# Patient Record
Sex: Female | Born: 1970 | Race: Black or African American | Hispanic: No | Smoking: Never smoker
Health system: Southern US, Community
[De-identification: ages and names within clinical notes are randomized; demographics above are authoritative.]

## PROBLEM LIST (undated history)

## (undated) DIAGNOSIS — Z973 Presence of spectacles and contact lenses: Secondary | ICD-10-CM

## (undated) DIAGNOSIS — E0500 Thyrotoxicosis with diffuse goiter without thyrotoxic crisis or storm: Secondary | ICD-10-CM

## (undated) DIAGNOSIS — G43909 Migraine, unspecified, not intractable, without status migrainosus: Secondary | ICD-10-CM

## (undated) DIAGNOSIS — Z9289 Personal history of other medical treatment: Secondary | ICD-10-CM

## (undated) DIAGNOSIS — R519 Headache, unspecified: Secondary | ICD-10-CM

## (undated) DIAGNOSIS — J302 Other seasonal allergic rhinitis: Secondary | ICD-10-CM

## (undated) DIAGNOSIS — I499 Cardiac arrhythmia, unspecified: Secondary | ICD-10-CM

## (undated) DIAGNOSIS — E039 Hypothyroidism, unspecified: Secondary | ICD-10-CM

## (undated) DIAGNOSIS — R51 Headache: Secondary | ICD-10-CM

## (undated) DIAGNOSIS — D219 Benign neoplasm of connective and other soft tissue, unspecified: Secondary | ICD-10-CM

## (undated) DIAGNOSIS — D649 Anemia, unspecified: Secondary | ICD-10-CM

## (undated) HISTORY — PX: KNEE ARTHROPLASTY: SHX992

## (undated) HISTORY — PX: UPPER GI ENDOSCOPY: SHX6162

## (undated) HISTORY — PX: CARPAL TUNNEL RELEASE: SHX101

## (undated) HISTORY — PX: FOOT SURGERY: SHX648

---

## 1999-04-25 ENCOUNTER — Other Ambulatory Visit: Admission: RE | Admit: 1999-04-25 | Discharge: 1999-04-25 | Payer: Self-pay | Admitting: *Deleted

## 2000-04-26 ENCOUNTER — Other Ambulatory Visit: Admission: RE | Admit: 2000-04-26 | Discharge: 2000-04-26 | Payer: Self-pay | Admitting: *Deleted

## 2000-05-07 ENCOUNTER — Emergency Department (HOSPITAL_COMMUNITY): Admission: EM | Admit: 2000-05-07 | Discharge: 2000-05-07 | Payer: Self-pay | Admitting: Emergency Medicine

## 2001-05-12 ENCOUNTER — Other Ambulatory Visit: Admission: RE | Admit: 2001-05-12 | Discharge: 2001-05-12 | Payer: Self-pay | Admitting: Obstetrics and Gynecology

## 2003-04-09 ENCOUNTER — Other Ambulatory Visit: Admission: RE | Admit: 2003-04-09 | Discharge: 2003-04-09 | Payer: Self-pay | Admitting: Obstetrics and Gynecology

## 2003-05-23 ENCOUNTER — Encounter: Admission: RE | Admit: 2003-05-23 | Discharge: 2003-05-23 | Payer: Self-pay | Admitting: Obstetrics and Gynecology

## 2004-02-01 ENCOUNTER — Encounter: Admission: RE | Admit: 2004-02-01 | Discharge: 2004-02-01 | Payer: Self-pay | Admitting: Obstetrics and Gynecology

## 2004-07-03 ENCOUNTER — Encounter: Admission: RE | Admit: 2004-07-03 | Discharge: 2004-07-03 | Payer: Self-pay | Admitting: Obstetrics and Gynecology

## 2005-07-16 ENCOUNTER — Emergency Department (HOSPITAL_COMMUNITY): Admission: EM | Admit: 2005-07-16 | Discharge: 2005-07-16 | Payer: Self-pay | Admitting: Emergency Medicine

## 2005-09-01 ENCOUNTER — Encounter: Admission: RE | Admit: 2005-09-01 | Discharge: 2005-09-01 | Payer: Self-pay | Admitting: Obstetrics and Gynecology

## 2006-11-02 ENCOUNTER — Encounter: Admission: RE | Admit: 2006-11-02 | Discharge: 2006-11-02 | Payer: Self-pay | Admitting: Obstetrics and Gynecology

## 2008-01-24 ENCOUNTER — Encounter: Admission: RE | Admit: 2008-01-24 | Discharge: 2008-01-24 | Payer: Self-pay | Admitting: Obstetrics and Gynecology

## 2009-07-08 ENCOUNTER — Encounter: Admission: RE | Admit: 2009-07-08 | Discharge: 2009-07-08 | Payer: Self-pay | Admitting: Obstetrics and Gynecology

## 2010-01-31 ENCOUNTER — Encounter: Admission: RE | Admit: 2010-01-31 | Discharge: 2010-01-31 | Payer: Self-pay | Admitting: Obstetrics and Gynecology

## 2010-04-13 ENCOUNTER — Encounter: Payer: Self-pay | Admitting: Family Medicine

## 2010-04-13 ENCOUNTER — Encounter: Payer: Self-pay | Admitting: Obstetrics and Gynecology

## 2010-04-14 ENCOUNTER — Encounter: Payer: Self-pay | Admitting: Obstetrics and Gynecology

## 2010-07-16 ENCOUNTER — Other Ambulatory Visit: Payer: Self-pay | Admitting: Obstetrics and Gynecology

## 2010-07-16 DIAGNOSIS — Z1231 Encounter for screening mammogram for malignant neoplasm of breast: Secondary | ICD-10-CM

## 2010-07-23 ENCOUNTER — Ambulatory Visit: Payer: Self-pay

## 2010-10-23 ENCOUNTER — Ambulatory Visit
Admission: RE | Admit: 2010-10-23 | Discharge: 2010-10-23 | Disposition: A | Discharge status: Home / Self Care | Payer: Private Health Insurance - Indemnity | Source: Ambulatory Visit | Attending: Obstetrics and Gynecology | Admitting: Obstetrics and Gynecology

## 2010-10-23 DIAGNOSIS — Z1231 Encounter for screening mammogram for malignant neoplasm of breast: Secondary | ICD-10-CM

## 2011-09-02 ENCOUNTER — Other Ambulatory Visit: Payer: Self-pay | Admitting: Family Medicine

## 2011-09-02 DIAGNOSIS — Z1231 Encounter for screening mammogram for malignant neoplasm of breast: Secondary | ICD-10-CM

## 2011-10-26 ENCOUNTER — Ambulatory Visit: Payer: Private Health Insurance - Indemnity

## 2011-11-24 ENCOUNTER — Other Ambulatory Visit: Payer: Self-pay | Admitting: Obstetrics and Gynecology

## 2011-11-24 ENCOUNTER — Ambulatory Visit
Admission: RE | Admit: 2011-11-24 | Discharge: 2011-11-24 | Disposition: A | Discharge status: Home / Self Care | Payer: BC Managed Care – PPO | Source: Ambulatory Visit | Attending: Family Medicine | Admitting: Family Medicine

## 2011-11-24 DIAGNOSIS — Z1231 Encounter for screening mammogram for malignant neoplasm of breast: Secondary | ICD-10-CM

## 2012-08-16 DIAGNOSIS — M25522 Pain in left elbow: Secondary | ICD-10-CM | POA: Insufficient documentation

## 2012-11-23 ENCOUNTER — Other Ambulatory Visit: Payer: Self-pay

## 2012-11-23 DIAGNOSIS — Z1231 Encounter for screening mammogram for malignant neoplasm of breast: Secondary | ICD-10-CM

## 2012-12-13 ENCOUNTER — Ambulatory Visit: Payer: BC Managed Care – PPO

## 2013-01-02 ENCOUNTER — Ambulatory Visit
Admission: RE | Admit: 2013-01-02 | Discharge: 2013-01-02 | Disposition: A | Discharge status: Home / Self Care | Payer: BC Managed Care – PPO | Source: Ambulatory Visit

## 2013-01-02 DIAGNOSIS — Z1231 Encounter for screening mammogram for malignant neoplasm of breast: Secondary | ICD-10-CM

## 2013-12-25 ENCOUNTER — Other Ambulatory Visit: Payer: Self-pay

## 2013-12-25 DIAGNOSIS — Z1239 Encounter for other screening for malignant neoplasm of breast: Secondary | ICD-10-CM

## 2014-01-08 ENCOUNTER — Ambulatory Visit: Payer: BC Managed Care – PPO

## 2014-01-25 ENCOUNTER — Inpatient Hospital Stay: Admission: RE | Admit: 2014-01-25 | Payer: BC Managed Care – PPO | Source: Ambulatory Visit

## 2014-02-20 ENCOUNTER — Ambulatory Visit
Admission: RE | Admit: 2014-02-20 | Discharge: 2014-02-20 | Disposition: A | Discharge status: Home / Self Care | Payer: BC Managed Care – PPO | Source: Ambulatory Visit

## 2014-02-20 ENCOUNTER — Other Ambulatory Visit: Payer: Self-pay

## 2014-02-20 DIAGNOSIS — Z1231 Encounter for screening mammogram for malignant neoplasm of breast: Secondary | ICD-10-CM

## 2014-05-15 ENCOUNTER — Other Ambulatory Visit: Payer: Self-pay | Admitting: Obstetrics and Gynecology

## 2014-05-15 DIAGNOSIS — D259 Leiomyoma of uterus, unspecified: Secondary | ICD-10-CM

## 2014-05-17 ENCOUNTER — Other Ambulatory Visit: Payer: Self-pay | Admitting: Obstetrics and Gynecology

## 2014-05-17 DIAGNOSIS — D259 Leiomyoma of uterus, unspecified: Secondary | ICD-10-CM

## 2014-05-29 ENCOUNTER — Ambulatory Visit
Admission: RE | Admit: 2014-05-29 | Discharge: 2014-05-29 | Disposition: A | Discharge status: Home / Self Care | Payer: BLUE CROSS/BLUE SHIELD | Source: Ambulatory Visit | Attending: Obstetrics and Gynecology | Admitting: Obstetrics and Gynecology

## 2014-05-29 DIAGNOSIS — D259 Leiomyoma of uterus, unspecified: Secondary | ICD-10-CM | POA: Insufficient documentation

## 2014-05-29 HISTORY — PX: IR GENERIC HISTORICAL: IMG1180011

## 2014-05-29 NOTE — Consult Note (Signed)
Chief Complaint: Chief Complaint  Patient presents with  . Fibroids    Consult for [place]     Referring Physician(s): Avon Gully  History of Present Illness: Carmen Henderson is a 44 y.o. (G2, P2) female with past medical history significant only for migraine headaches who presents to the interventional radiology clinic for evaluation of symptomatic uterine fibroids. The patient is unaccompanied and serves as her own historian.  Patient has history of menorrhagia dating back for several years though states this has significant late worsening during the past year. The patient states that her cycle remains regular occurring every 21 days and lasting for approximately 5 days, 2-3 days of which are associated with significant menorrhagia requiring the changing of pads every 1-2 hours. Patient denies intermittent bleeding or spotting. Patient previously has been anemic though is currently intermittently taking over the counter iron supplementations and was not noted to be anemic on most recently obtained CBC with hemoglobin of 12.3 (04/09/2014). The patient has never received a blood transfusion.  Patient also reports bulk symptoms including persistent mild abdominal and pelvic pain which significantly worsens at the time of her cycle. She also reports abdominal bloating. She denies constipation, diarrhea, bloody or melanotic stools. She admits to urinary frequency, urgency and nocturia, getting up to urinate approximately 2 times per evening. The patient does not report any perimenopausal symptoms.  The patient does not desire having any additional children and is not interested in undergoing a surgical procedure.  Past medical history: Migraine headaches  Past surgical history: #1-right-sided carpal tunnel release - 2015. #2-right-sided meniscus knee repair - 2009. #3-left sided plantar fasciitis repair - 2005.  Allergies: No known drug allergies. Patient denies latex or contrast  allergy.  Medications: Prior to Admission medications   Not on File    Family history: Noncontributory. Familial history of asthma, breast cancer, leukemia, diabetes, hypertension, stroke and thyroid dysfunction.  Social History: Patient is divorced. She is currently employed at [employer]. Patient has 2 children, a son 66 years old and a daughter who is 47 years old. Patient does not drink alcohol or smoke.  ECOG Status: 0 - Asymptomatic  Review of Systems: A 12 point ROS discussed and pertinent positives are indicated in the HPI above.  All other systems are negative.  Review of Systems  Constitutional: Negative.   Respiratory: Negative.   Gastrointestinal: Positive for abdominal pain. Negative for abdominal distention.  Genitourinary: Positive for frequency, menstrual problem and pelvic pain. Negative for dysuria, urgency, hematuria, flank pain, vaginal discharge and difficulty urinating.  Neurological: Positive for headaches.  Psychiatric/Behavioral: Negative.     Vital Signs: BP 136/81 mmHg  Pulse 90  Temp(Src) 98.5 F (36.9 C) (Oral)  Resp 15  Ht 4\' 11"  (1.499 m)  Wt 228 lb (103.42 kg)  BMI 46.03 kg/m2  SpO2 100%  LMP 05/22/2014 (Exact Date)  Physical Exam  Constitutional: She appears well-developed and well-nourished.  Cardiovascular: Normal rate, regular rhythm and intact distal pulses.   Pulmonary/Chest: Effort normal and breath sounds normal.  Abdominal: Soft. Bowel sounds are normal. She exhibits no distension and no mass. There is no tenderness.  I am unable to palpate the uterine fundus.  Skin: Skin is warm and dry.  Psychiatric: She has a normal mood and affect.   Imaging: Pelvic ultrasound performed 04/09/2014. Report only, no images.  The uterus is enlarged measuring 16.0 x 12.6 x 7.8 cm and is noted to contain several large fibroids, the  largest of which within the right uterine fundus measuring approximately 7.5 x 7.4 x 6.4 cm and  the largest of which within the left uterine fundus measuring 6.3 x 6.1 x 4.8 cm.  Labs:  Pap smear (04/09/2014) - negative for intraepithelial lesion or malignancy.  CBC: No results for input(s): WBC, HGB, HCT, PLT in the last 8760 hours.  COAGS: No results for input(s): INR, APTT in the last 8760 hours.  BMP: No results for input(s): NA, K, CL, CO2, GLUCOSE, BUN, CALCIUM, CREATININE, GFRNONAA, GFRAA in the last 8760 hours.  Invalid input(s): CMP  LIVER FUNCTION TESTS: No results for input(s): BILITOT, AST, ALT, ALKPHOS, PROT, ALBUMIN in the last 8760 hours.  Assessment and Plan:  This is a 44 y.o. (G2, P2) female with past medical history significant only for migraine headaches who presents to the interventional radiology clinic for evaluation of symptomatic uterine fibroids. Patient has history of menorrhagia dating back for several years though states this has significant late worsening during the past year. The patient states that her cycle remains regular occurring every 21 days and lasting for approximately 5 days, 2-3 days of which are associated with significant menorrhagia requiring the changing of pads every 1-2 hours. Patient denies intermittent bleeding or spotting.  The patient also reports bulk symptoms including persistent mild abdominal and pelvic pain which significantly worsens at the time of her cycle. She also reports abdominal bloating. She admits to urinary frequency, urgency and nocturia, getting up to urinate approximately 2 times per evening. The patient does not report any perimenopausal symptoms.  The patient had negative Pap smear performed 04/09/2014.   Prolonged discussions were held with the patient regarding alternative therapies including conservative management, hysterectomy and myomectomy. At the present time, the patient does not wish to undergo a surgical procedure.  As such, prolonged conversations were held with the patient regarding the risks  (including but not limited to bleeding, vessel injury, infection, nontarget embolization, contrast and radiation exposure) and benefits of uterine fibroid embolization.  Following this discussion, the patient wishes to proceed with the uterine fibroid embolization procedure.  We will arrange for the patient to have an endometrial biopsy as well as a pelvic MRI. Following the acquisition of these examinations, the uterine fibroid embolization will be scheduled at [facility]. The procedure will entail an overnight admission for observation and PCA usage.  Thank you for this interesting consult.  I greatly enjoyed meeting Carmen Henderson and look forward to participating in her care.  SignedSandi Mariscal 05/29/2014, 10:04 AM   I spent a total of 30 Minutes in face to face in clinical consultation, greater than 50% of which was counseling/coordinating care for symptomatic uterine fibroids.

## 2014-05-31 ENCOUNTER — Ambulatory Visit: Payer: BC Managed Care – PPO | Admitting: Family Medicine

## 2014-06-13 ENCOUNTER — Inpatient Hospital Stay: Admission: RE | Admit: 2014-06-13 | Payer: BLUE CROSS/BLUE SHIELD | Source: Ambulatory Visit

## 2014-07-01 ENCOUNTER — Inpatient Hospital Stay: Admission: RE | Admit: 2014-07-01 | Payer: BLUE CROSS/BLUE SHIELD | Source: Ambulatory Visit

## 2014-07-17 ENCOUNTER — Other Ambulatory Visit: Payer: Self-pay | Admitting: Obstetrics and Gynecology

## 2014-07-17 DIAGNOSIS — N644 Mastodynia: Secondary | ICD-10-CM

## 2014-07-23 ENCOUNTER — Ambulatory Visit
Admission: RE | Admit: 2014-07-23 | Discharge: 2014-07-23 | Disposition: A | Discharge status: Home / Self Care | Payer: BLUE CROSS/BLUE SHIELD | Source: Ambulatory Visit | Attending: Obstetrics and Gynecology | Admitting: Obstetrics and Gynecology

## 2014-07-23 DIAGNOSIS — N644 Mastodynia: Secondary | ICD-10-CM

## 2014-08-09 ENCOUNTER — Ambulatory Visit
Admission: RE | Admit: 2014-08-09 | Discharge: 2014-08-09 | Disposition: A | Discharge status: Home / Self Care | Payer: BLUE CROSS/BLUE SHIELD | Source: Ambulatory Visit | Attending: Obstetrics and Gynecology | Admitting: Obstetrics and Gynecology

## 2014-08-09 DIAGNOSIS — D259 Leiomyoma of uterus, unspecified: Secondary | ICD-10-CM

## 2014-08-09 MED ORDER — GADOBENATE DIMEGLUMINE 529 MG/ML IV SOLN
20.0000 mL | Freq: Once | INTRAVENOUS | Status: AC | PRN
  Administered 2014-08-09: 20 mL via INTRAVENOUS

## 2014-08-15 ENCOUNTER — Other Ambulatory Visit: Payer: Self-pay | Admitting: Interventional Radiology

## 2014-08-15 DIAGNOSIS — D259 Leiomyoma of uterus, unspecified: Secondary | ICD-10-CM

## 2014-08-27 ENCOUNTER — Ambulatory Visit (HOSPITAL_COMMUNITY): Payer: BLUE CROSS/BLUE SHIELD

## 2014-08-27 ENCOUNTER — Other Ambulatory Visit (HOSPITAL_COMMUNITY): Payer: BLUE CROSS/BLUE SHIELD

## 2014-09-07 ENCOUNTER — Other Ambulatory Visit: Payer: Self-pay | Admitting: Radiology

## 2014-09-10 ENCOUNTER — Encounter (HOSPITAL_COMMUNITY): Payer: Self-pay

## 2014-09-10 ENCOUNTER — Ambulatory Visit (HOSPITAL_COMMUNITY)
Admission: RE | Admit: 2014-09-10 | Discharge: 2014-09-10 | Disposition: A | Discharge status: Home / Self Care | Payer: BLUE CROSS/BLUE SHIELD | Source: Ambulatory Visit | Attending: Interventional Radiology | Admitting: Interventional Radiology

## 2014-09-10 ENCOUNTER — Observation Stay (HOSPITAL_COMMUNITY)
Admission: RE | Admit: 2014-09-10 | Discharge: 2014-09-11 | Disposition: A | Discharge status: Home / Self Care | Payer: BLUE CROSS/BLUE SHIELD | Source: Ambulatory Visit | Attending: Interventional Radiology | Admitting: Interventional Radiology

## 2014-09-10 DIAGNOSIS — D259 Leiomyoma of uterus, unspecified: Principal | ICD-10-CM | POA: Insufficient documentation

## 2014-09-10 DIAGNOSIS — G43909 Migraine, unspecified, not intractable, without status migrainosus: Secondary | ICD-10-CM | POA: Diagnosis not present

## 2014-09-10 HISTORY — DX: Benign neoplasm of connective and other soft tissue, unspecified: D21.9

## 2014-09-10 HISTORY — DX: Headache: R51

## 2014-09-10 HISTORY — DX: Migraine, unspecified, not intractable, without status migrainosus: G43.909

## 2014-09-10 HISTORY — DX: Headache, unspecified: R51.9

## 2014-09-10 LAB — BASIC METABOLIC PANEL
ANION GAP: 7 (ref 5–15)
BUN: 12 mg/dL (ref 6–20)
CALCIUM: 8.9 mg/dL (ref 8.9–10.3)
CO2: 24 mmol/L (ref 22–32)
CREATININE: 0.74 mg/dL (ref 0.44–1.00)
Chloride: 109 mmol/L (ref 101–111)
GFR calc Af Amer: >60 mL/min (ref >60)
GFR calc non Af Amer: >60 mL/min (ref >60)
GLUCOSE: 106 mg/dL — AB (ref 65–99)
Potassium: 4.1 mmol/L (ref 3.5–5.1)
Sodium: 140 mmol/L (ref 135–145)

## 2014-09-10 LAB — CBC WITH DIFFERENTIAL/PLATELET
BASOS ABS: 0 10*3/uL (ref 0.0–0.1)
Basophils Relative: 0 % (ref 0–1)
EOS ABS: 0 10*3/uL (ref 0.0–0.7)
EOS PCT: 0 % (ref 0–5)
HCT: 36.7 % (ref 36.0–46.0)
Hemoglobin: 11.6 g/dL — ABNORMAL LOW (ref 12.0–15.0)
LYMPHS PCT: 42 % (ref 12–46)
Lymphs Abs: 1.5 10*3/uL (ref 0.7–4.0)
MCH: 29.4 pg (ref 26.0–34.0)
MCHC: 31.6 g/dL (ref 30.0–36.0)
MCV: 93.1 fL (ref 78.0–100.0)
MONO ABS: 0.3 10*3/uL (ref 0.1–1.0)
Monocytes Relative: 7 % (ref 3–12)
Neutro Abs: 1.9 10*3/uL (ref 1.7–7.7)
Neutrophils Relative %: 51 % (ref 43–77)
Platelets: 203 10*3/uL (ref 150–400)
RBC: 3.94 MIL/uL (ref 3.87–5.11)
RDW: 14.5 % (ref 11.5–15.5)
WBC: 3.7 10*3/uL — ABNORMAL LOW (ref 4.0–10.5)

## 2014-09-10 LAB — PROTIME-INR
INR: 1.02 (ref 0.00–1.49)
Prothrombin Time: 13.6 seconds (ref 11.6–15.2)

## 2014-09-10 LAB — HCG, SERUM, QUALITATIVE: PREG SERUM: NEGATIVE

## 2014-09-10 MED ORDER — LIDOCAINE HCL 1 % IJ SOLN
INTRAMUSCULAR | Status: AC
  Filled 2014-09-10: qty 20

## 2014-09-10 MED ORDER — MIDAZOLAM HCL 2 MG/2ML IJ SOLN
INTRAMUSCULAR | Status: AC
  Filled 2014-09-10: qty 6

## 2014-09-10 MED ORDER — ONDANSETRON HCL 4 MG/2ML IJ SOLN
4.0000 mg | Freq: Four times a day (QID) | INTRAMUSCULAR | Status: DC | PRN
  Administered 2014-09-10: 4 mg via INTRAVENOUS

## 2014-09-10 MED ORDER — NALOXONE HCL 0.4 MG/ML IJ SOLN: 0.4000 mg | INTRAMUSCULAR | Status: DC | PRN

## 2014-09-10 MED ORDER — DIPHENHYDRAMINE HCL 12.5 MG/5ML PO ELIX: 12.5000 mg | ORAL_SOLUTION | Freq: Four times a day (QID) | ORAL | Status: DC | PRN

## 2014-09-10 MED ORDER — DIPHENHYDRAMINE HCL 12.5 MG/5ML PO ELIX
12.5000 mg | ORAL_SOLUTION | Freq: Four times a day (QID) | ORAL | Status: DC | PRN
  Filled 2014-09-10: qty 5

## 2014-09-10 MED ORDER — KETOROLAC TROMETHAMINE 30 MG/ML IJ SOLN
INTRAMUSCULAR | Status: AC
  Filled 2014-09-10: qty 1

## 2014-09-10 MED ORDER — FENTANYL CITRATE (PF) 100 MCG/2ML IJ SOLN
INTRAMUSCULAR | Status: AC | PRN
  Administered 2014-09-10: 25 ug via INTRAVENOUS
  Administered 2014-09-10: 50 ug via INTRAVENOUS
  Administered 2014-09-10: 25 ug via INTRAVENOUS

## 2014-09-10 MED ORDER — HYDROMORPHONE HCL 2 MG/ML IJ SOLN
INTRAMUSCULAR | Status: AC
  Filled 2014-09-10: qty 1

## 2014-09-10 MED ORDER — PROMETHAZINE HCL 25 MG RE SUPP: 25.0000 mg | Freq: Three times a day (TID) | RECTAL | Status: DC | PRN

## 2014-09-10 MED ORDER — TOPIRAMATE 25 MG PO TABS
25.0000 mg | ORAL_TABLET | Freq: Every evening | ORAL | Status: DC | PRN
  Administered 2014-09-10: 25 mg via ORAL
  Filled 2014-09-10 (×4): qty 1

## 2014-09-10 MED ORDER — SODIUM CHLORIDE 0.9 % IJ SOLN: 3.0000 mL | Freq: Two times a day (BID) | INTRAMUSCULAR | Status: DC

## 2014-09-10 MED ORDER — DIPHENHYDRAMINE HCL 50 MG/ML IJ SOLN: 12.5000 mg | Freq: Four times a day (QID) | INTRAMUSCULAR | Status: DC | PRN

## 2014-09-10 MED ORDER — KETOROLAC TROMETHAMINE 30 MG/ML IJ SOLN: 30.0000 mg | Freq: Once | INTRAMUSCULAR | Status: DC

## 2014-09-10 MED ORDER — SODIUM CHLORIDE 0.9 % IJ SOLN: 3.0000 mL | INTRAMUSCULAR | Status: DC | PRN

## 2014-09-10 MED ORDER — SODIUM CHLORIDE 0.9 % IV SOLN: 250.0000 mL | INTRAVENOUS | Status: DC | PRN

## 2014-09-10 MED ORDER — IOHEXOL 300 MG/ML  SOLN
25.0000 mL | Freq: Once | INTRAMUSCULAR | Status: AC | PRN
  Administered 2014-09-10: 85 mL via INTRA_ARTERIAL

## 2014-09-10 MED ORDER — DIPHENHYDRAMINE HCL 50 MG/ML IJ SOLN
12.5000 mg | Freq: Four times a day (QID) | INTRAMUSCULAR | Status: DC | PRN
  Administered 2014-09-10: 12.5 mg via INTRAVENOUS
  Filled 2014-09-10: qty 1

## 2014-09-10 MED ORDER — MIDAZOLAM HCL 2 MG/2ML IJ SOLN
INTRAMUSCULAR | Status: AC | PRN
  Administered 2014-09-10: 0.5 mg via INTRAVENOUS
  Administered 2014-09-10 (×2): 1 mg via INTRAVENOUS
  Administered 2014-09-10: 0.5 mg via INTRAVENOUS
  Administered 2014-09-10 (×3): 1 mg via INTRAVENOUS

## 2014-09-10 MED ORDER — IBUPROFEN 800 MG PO TABS
800.0000 mg | ORAL_TABLET | Freq: Four times a day (QID) | ORAL | Status: AC
  Administered 2014-09-10 (×2): 800 mg via ORAL
  Filled 2014-09-10 (×3): qty 1

## 2014-09-10 MED ORDER — SODIUM CHLORIDE 0.9 % IV SOLN
INTRAVENOUS | Status: DC
Start: 2014-09-10 — End: 2014-09-11
  Administered 2014-09-10: 09:00:00 via INTRAVENOUS

## 2014-09-10 MED ORDER — DOCUSATE SODIUM 100 MG PO CAPS
100.0000 mg | ORAL_CAPSULE | Freq: Two times a day (BID) | ORAL | Status: DC
  Administered 2014-09-10 – 2014-09-11 (×2): 100 mg via ORAL
  Filled 2014-09-10 (×4): qty 1

## 2014-09-10 MED ORDER — IOHEXOL 300 MG/ML  SOLN
80.0000 mL | Freq: Once | INTRAMUSCULAR | Status: AC | PRN
  Administered 2014-09-10: 25 mL via INTRA_ARTERIAL

## 2014-09-10 MED ORDER — PROMETHAZINE HCL 25 MG PO TABS
25.0000 mg | ORAL_TABLET | Freq: Three times a day (TID) | ORAL | Status: DC | PRN
  Administered 2014-09-10: 25 mg via ORAL
  Filled 2014-09-10: qty 1

## 2014-09-10 MED ORDER — KETOROLAC TROMETHAMINE 30 MG/ML IJ SOLN
30.0000 mg | Freq: Once | INTRAMUSCULAR | Status: AC
  Administered 2014-09-11: 30 mg via INTRAVENOUS
  Filled 2014-09-10: qty 1

## 2014-09-10 MED ORDER — HYDROMORPHONE 0.3 MG/ML IV SOLN
INTRAVENOUS | Status: DC
  Administered 2014-09-10: 2.9 mg via INTRAVENOUS
  Administered 2014-09-10: 18:00:00 via INTRAVENOUS
  Administered 2014-09-10: 6 mg via INTRAVENOUS
  Administered 2014-09-10: 13:00:00 via INTRAVENOUS
  Administered 2014-09-11: 1.8 mg via INTRAVENOUS
  Filled 2014-09-10: qty 25

## 2014-09-10 MED ORDER — CEFAZOLIN SODIUM-DEXTROSE 2-3 GM-% IV SOLR
INTRAVENOUS | Status: AC
  Filled 2014-09-10: qty 50

## 2014-09-10 MED ORDER — HYDROMORPHONE 0.3 MG/ML IV SOLN
INTRAVENOUS | Status: DC
  Administered 2014-09-10: 12:00:00 via INTRAVENOUS

## 2014-09-10 MED ORDER — KETOROLAC TROMETHAMINE 30 MG/ML IJ SOLN
30.0000 mg | Freq: Once | INTRAMUSCULAR | Status: AC
  Administered 2014-09-10: 30 mg via INTRAVENOUS
  Filled 2014-09-10: qty 1

## 2014-09-10 MED ORDER — SODIUM CHLORIDE 0.9 % IJ SOLN: 9.0000 mL | INTRAMUSCULAR | Status: DC | PRN

## 2014-09-10 MED ORDER — CEFAZOLIN SODIUM-DEXTROSE 2-3 GM-% IV SOLR
2.0000 g | Freq: Once | INTRAVENOUS | Status: AC
  Administered 2014-09-10: 2 g via INTRAVENOUS

## 2014-09-10 MED ORDER — HYDROMORPHONE 0.3 MG/ML IV SOLN
INTRAVENOUS | Status: AC
  Filled 2014-09-10: qty 25

## 2014-09-10 MED ORDER — ONDANSETRON HCL 4 MG/2ML IJ SOLN
4.0000 mg | Freq: Four times a day (QID) | INTRAMUSCULAR | Status: DC | PRN
  Filled 2014-09-10: qty 2

## 2014-09-10 MED ORDER — FENTANYL CITRATE (PF) 100 MCG/2ML IJ SOLN
INTRAMUSCULAR | Status: AC
Start: 2014-09-10 — End: 2014-09-10
  Filled 2014-09-10: qty 4

## 2014-09-10 MED ORDER — HYDROMORPHONE HCL 1 MG/ML IJ SOLN
INTRAMUSCULAR | Status: AC | PRN
  Administered 2014-09-10: 0.5 mg via INTRAVENOUS
  Administered 2014-09-10: 1 mg via INTRAVENOUS
  Administered 2014-09-10: 0.5 mg via INTRAVENOUS

## 2014-09-10 MED ORDER — SUMATRIPTAN SUCCINATE 50 MG PO TABS
50.0000 mg | ORAL_TABLET | ORAL | Status: DC | PRN
  Filled 2014-09-10: qty 1

## 2014-09-10 MED ORDER — ONDANSETRON HCL 4 MG/2ML IJ SOLN: 4.0000 mg | Freq: Four times a day (QID) | INTRAMUSCULAR | Status: DC | PRN

## 2014-09-10 NOTE — Progress Notes (Signed)
Referring Physician(s): Avon Gully Subjective: Patient admits to lower pelvic pain/cramping. She denies any right groin site pain. She denies any nausea with liquids.   Allergies: Review of patient's allergies indicates no known allergies.  Medications: Prior to Admission medications   Medication Sig Start Date End Date Taking? Authorizing Provider  aspirin-acetaminophen-caffeine (EXCEDRIN MIGRAINE) [phone] MG per tablet Take 2 tablets by mouth every 6 (six) hours as needed for headache.   Yes Historical Provider, MD  ferrous sulfate 325 (65 FE) MG EC tablet Take 325 mg by mouth daily.   Yes Historical Provider, MD  Multiple Vitamin (MULTIVITAMIN WITH MINERALS) TABS tablet Take 1 tablet by mouth daily.   Yes Historical Provider, MD  rizatriptan (MAXALT) 10 MG tablet Take 10 mg by mouth as needed for migraine. May repeat in 2 hours if needed   Yes Historical Provider, MD  topiramate (TOPAMAX) 25 MG tablet Take 25 mg by mouth at bedtime and may repeat dose one time if needed.   Yes Historical Provider, MD   Vital Signs: BP 125/70 mmHg  Pulse 81  Temp(Src) 97.6 F (36.4 C) (Oral)  Resp 13  SpO2 99%  Physical Exam General: NAD, in and out of sleep Abd: Soft, TTP lower pelvic region, ND Ext: RCFA access dressing C/D/I, soft, NT, no signs of bleeding/hematoma, DP 2+ B/L  Imaging: Ir Angiogram Pelvis Selective Or Supraselective  09/10/2014   INDICATION: History of symptomatic uterine fibroids. Patient initially seen in consultation on 05/29/2014 and presents today for fluoroscopic guided bilateral uterine artery embolization.  EXAM: 1. ULTRASOUND GUIDANCE FOR ARTERIAL ACCESS 2. FLUOROSCOPIC GUIDED BILATERAL UTERINE ARTERY EMBOLIZATION  MEDICATIONS: Ancef 2 gm IV; The antibiotic was administered within one hour of the procedure  ANESTHESIA/SEDATION: Fentanyl 100 mcg IV; Versed 6 mg IV; Dilaudid 2 mg IV; Toradol 30 mg IV  Sedation time  70 minutes  CONTRAST:  97mL OMNIPAQUE IOHEXOL  300 MG/ML SOLN, 88mL OMNIPAQUE IOHEXOL 300 MG/ML SOLN  FLUOROSCOPY TIME:  20 minutes 6 seconds (1,219 mGy)  COMPLICATIONS: None immediate  PROCEDURE: Informed consent was obtained from the patient following explanation of the procedure, risks, benefits and alternatives. The patient understands, agrees and consents for the procedure. All questions were addressed. A time out was performed prior to the initiation of the procedure. Maximal barrier sterile technique utilized including caps, mask, sterile gowns, sterile gloves, large sterile drape, hand hygiene, and Betadine prep.  The right femoral head was marked fluoroscopically. Under sterile conditions and local anesthesia, the right common femoral artery access was performed with a micropuncture needle. Under direct ultrasound guidance, the right common femoral was accessed with a micropuncture kit. An ultrasound image was saved for documentation purposes. This allowed for placement of a 5-French vascular sheath. A limited arteriogram was performed through the side arm of the sheath confirming appropriate access within the right common femoral artery.  The 5-French C2 catheter was utilized to select the contralateral left internal iliac artery. Selective left internal iliac angiogram was performed. The tortuous left uterine artery was identified. Selective catheterization was performed of the left uterine artery with a microcatheter and micro guide wire. A selective left uterine angiogram was performed. This demonstrated patency of the left uterine artery. Mild diffuse hypervascularity of the enlarged fibroid uterus. Access was adequate for embolization. For embolization, 3 vials of 500 - 700 micron Embospheres were injected into the left uterine artery. Post embolization angiogram confirms complete stasis of the left uterine vascular territory. Microcatheter was removed.  The C2 catheter was  retracted and utilized to select the right internal iliac artery.  Selective right internal iliac angiogram was performed. The patent right uterine artery was identified. For selective catheterization, the micro catheter and guidewire were utilized to select the right uterine artery. Selective right uterine angiogram was performed. This demonstrated patency of the right uterine artery. Catheter position was safe for embolization. Embolization was performed to complete stasis with injection of 2 vials of 500-700 micron Embospheres. Post embolization angiogram confirms complete stasis of the right uterine vascular territory.  At this point, all wires, catheters and sheaths were removed from the patient. Hemostasis was achieved at the right groin access site with  The patient tolerated the procedure well without immediate post procedural complication.  FINDINGS: Selective bilateral uterine artery arteriogram demonstrates diffuse hypervascularity of the enlarged fibroid uterus.  Completion arteriograms following bilateral uterine artery particle embolization demonstrates a technically excellent result with stasis of flow within the bilateral uterine vascular territories.  IMPRESSION: Successful bilateral uterine artery embolization (U F E).  PLAN: Patient will be admitted overnight for continued observation and PCA usage with hope for discharge in the a.m.  The patient will be seen in follow-up consultation in approximately 1 month. No imaging or laboratories will be necessary for this postprocedural evaluation.   Electronically Signed   By: Sandi Mariscal M.D.   On: 09/10/2014 15:07   Ir Angiogram Pelvis Selective Or Supraselective  09/10/2014   INDICATION: History of symptomatic uterine fibroids. Patient initially seen in consultation on 05/29/2014 and presents today for fluoroscopic guided bilateral uterine artery embolization.  EXAM: 1. ULTRASOUND GUIDANCE FOR ARTERIAL ACCESS 2. FLUOROSCOPIC GUIDED BILATERAL UTERINE ARTERY EMBOLIZATION  MEDICATIONS: Ancef 2 gm IV; The antibiotic  was administered within one hour of the procedure  ANESTHESIA/SEDATION: Fentanyl 100 mcg IV; Versed 6 mg IV; Dilaudid 2 mg IV; Toradol 30 mg IV  Sedation time  70 minutes  CONTRAST:  32mL OMNIPAQUE IOHEXOL 300 MG/ML SOLN, 36mL OMNIPAQUE IOHEXOL 300 MG/ML SOLN  FLUOROSCOPY TIME:  20 minutes 6 seconds (2,979 mGy)  COMPLICATIONS: None immediate  PROCEDURE: Informed consent was obtained from the patient following explanation of the procedure, risks, benefits and alternatives. The patient understands, agrees and consents for the procedure. All questions were addressed. A time out was performed prior to the initiation of the procedure. Maximal barrier sterile technique utilized including caps, mask, sterile gowns, sterile gloves, large sterile drape, hand hygiene, and Betadine prep.  The right femoral head was marked fluoroscopically. Under sterile conditions and local anesthesia, the right common femoral artery access was performed with a micropuncture needle. Under direct ultrasound guidance, the right common femoral was accessed with a micropuncture kit. An ultrasound image was saved for documentation purposes. This allowed for placement of a 5-French vascular sheath. A limited arteriogram was performed through the side arm of the sheath confirming appropriate access within the right common femoral artery.  The 5-French C2 catheter was utilized to select the contralateral left internal iliac artery. Selective left internal iliac angiogram was performed. The tortuous left uterine artery was identified. Selective catheterization was performed of the left uterine artery with a microcatheter and micro guide wire. A selective left uterine angiogram was performed. This demonstrated patency of the left uterine artery. Mild diffuse hypervascularity of the enlarged fibroid uterus. Access was adequate for embolization. For embolization, 3 vials of 500 - 700 micron Embospheres were injected into the left uterine artery. Post  embolization angiogram confirms complete stasis of the left uterine vascular territory. Microcatheter was removed.  The C2 catheter was retracted and utilized to select the right internal iliac artery. Selective right internal iliac angiogram was performed. The patent right uterine artery was identified. For selective catheterization, the micro catheter and guidewire were utilized to select the right uterine artery. Selective right uterine angiogram was performed. This demonstrated patency of the right uterine artery. Catheter position was safe for embolization. Embolization was performed to complete stasis with injection of 2 vials of 500-700 micron Embospheres. Post embolization angiogram confirms complete stasis of the right uterine vascular territory.  At this point, all wires, catheters and sheaths were removed from the patient. Hemostasis was achieved at the right groin access site with  The patient tolerated the procedure well without immediate post procedural complication.  FINDINGS: Selective bilateral uterine artery arteriogram demonstrates diffuse hypervascularity of the enlarged fibroid uterus.  Completion arteriograms following bilateral uterine artery particle embolization demonstrates a technically excellent result with stasis of flow within the bilateral uterine vascular territories.  IMPRESSION: Successful bilateral uterine artery embolization (U F E).  PLAN: Patient will be admitted overnight for continued observation and PCA usage with hope for discharge in the a.m.  The patient will be seen in follow-up consultation in approximately 1 month. No imaging or laboratories will be necessary for this postprocedural evaluation.   Electronically Signed   By: Sandi Mariscal M.D.   On: 09/10/2014 15:07   Ir Angiogram Selective Each Additional Vessel  09/10/2014   INDICATION: History of symptomatic uterine fibroids. Patient initially seen in consultation on 05/29/2014 and presents today for fluoroscopic  guided bilateral uterine artery embolization.  EXAM: 1. ULTRASOUND GUIDANCE FOR ARTERIAL ACCESS 2. FLUOROSCOPIC GUIDED BILATERAL UTERINE ARTERY EMBOLIZATION  MEDICATIONS: Ancef 2 gm IV; The antibiotic was administered within one hour of the procedure  ANESTHESIA/SEDATION: Fentanyl 100 mcg IV; Versed 6 mg IV; Dilaudid 2 mg IV; Toradol 30 mg IV  Sedation time  70 minutes  CONTRAST:  53mL OMNIPAQUE IOHEXOL 300 MG/ML SOLN, 52mL OMNIPAQUE IOHEXOL 300 MG/ML SOLN  FLUOROSCOPY TIME:  20 minutes 6 seconds (2,778 mGy)  COMPLICATIONS: None immediate  PROCEDURE: Informed consent was obtained from the patient following explanation of the procedure, risks, benefits and alternatives. The patient understands, agrees and consents for the procedure. All questions were addressed. A time out was performed prior to the initiation of the procedure. Maximal barrier sterile technique utilized including caps, mask, sterile gowns, sterile gloves, large sterile drape, hand hygiene, and Betadine prep.  The right femoral head was marked fluoroscopically. Under sterile conditions and local anesthesia, the right common femoral artery access was performed with a micropuncture needle. Under direct ultrasound guidance, the right common femoral was accessed with a micropuncture kit. An ultrasound image was saved for documentation purposes. This allowed for placement of a 5-French vascular sheath. A limited arteriogram was performed through the side arm of the sheath confirming appropriate access within the right common femoral artery.  The 5-French C2 catheter was utilized to select the contralateral left internal iliac artery. Selective left internal iliac angiogram was performed. The tortuous left uterine artery was identified. Selective catheterization was performed of the left uterine artery with a microcatheter and micro guide wire. A selective left uterine angiogram was performed. This demonstrated patency of the left uterine artery. Mild  diffuse hypervascularity of the enlarged fibroid uterus. Access was adequate for embolization. For embolization, 3 vials of 500 - 700 micron Embospheres were injected into the left uterine artery. Post embolization angiogram confirms complete stasis of the left uterine vascular territory.  Microcatheter was removed.  The C2 catheter was retracted and utilized to select the right internal iliac artery. Selective right internal iliac angiogram was performed. The patent right uterine artery was identified. For selective catheterization, the micro catheter and guidewire were utilized to select the right uterine artery. Selective right uterine angiogram was performed. This demonstrated patency of the right uterine artery. Catheter position was safe for embolization. Embolization was performed to complete stasis with injection of 2 vials of 500-700 micron Embospheres. Post embolization angiogram confirms complete stasis of the right uterine vascular territory.  At this point, all wires, catheters and sheaths were removed from the patient. Hemostasis was achieved at the right groin access site with  The patient tolerated the procedure well without immediate post procedural complication.  FINDINGS: Selective bilateral uterine artery arteriogram demonstrates diffuse hypervascularity of the enlarged fibroid uterus.  Completion arteriograms following bilateral uterine artery particle embolization demonstrates a technically excellent result with stasis of flow within the bilateral uterine vascular territories.  IMPRESSION: Successful bilateral uterine artery embolization (U F E).  PLAN: Patient will be admitted overnight for continued observation and PCA usage with hope for discharge in the a.m.  The patient will be seen in follow-up consultation in approximately 1 month. No imaging or laboratories will be necessary for this postprocedural evaluation.   Electronically Signed   By: Sandi Mariscal M.D.   On: 09/10/2014 15:07   Ir  Angiogram Selective Each Additional Vessel  09/10/2014   INDICATION: History of symptomatic uterine fibroids. Patient initially seen in consultation on 05/29/2014 and presents today for fluoroscopic guided bilateral uterine artery embolization.  EXAM: 1. ULTRASOUND GUIDANCE FOR ARTERIAL ACCESS 2. FLUOROSCOPIC GUIDED BILATERAL UTERINE ARTERY EMBOLIZATION  MEDICATIONS: Ancef 2 gm IV; The antibiotic was administered within one hour of the procedure  ANESTHESIA/SEDATION: Fentanyl 100 mcg IV; Versed 6 mg IV; Dilaudid 2 mg IV; Toradol 30 mg IV  Sedation time  70 minutes  CONTRAST:  95mL OMNIPAQUE IOHEXOL 300 MG/ML SOLN, 58mL OMNIPAQUE IOHEXOL 300 MG/ML SOLN  FLUOROSCOPY TIME:  20 minutes 6 seconds (8,638 mGy)  COMPLICATIONS: None immediate  PROCEDURE: Informed consent was obtained from the patient following explanation of the procedure, risks, benefits and alternatives. The patient understands, agrees and consents for the procedure. All questions were addressed. A time out was performed prior to the initiation of the procedure. Maximal barrier sterile technique utilized including caps, mask, sterile gowns, sterile gloves, large sterile drape, hand hygiene, and Betadine prep.  The right femoral head was marked fluoroscopically. Under sterile conditions and local anesthesia, the right common femoral artery access was performed with a micropuncture needle. Under direct ultrasound guidance, the right common femoral was accessed with a micropuncture kit. An ultrasound image was saved for documentation purposes. This allowed for placement of a 5-French vascular sheath. A limited arteriogram was performed through the side arm of the sheath confirming appropriate access within the right common femoral artery.  The 5-French C2 catheter was utilized to select the contralateral left internal iliac artery. Selective left internal iliac angiogram was performed. The tortuous left uterine artery was identified. Selective  catheterization was performed of the left uterine artery with a microcatheter and micro guide wire. A selective left uterine angiogram was performed. This demonstrated patency of the left uterine artery. Mild diffuse hypervascularity of the enlarged fibroid uterus. Access was adequate for embolization. For embolization, 3 vials of 500 - 700 micron Embospheres were injected into the left uterine artery. Post embolization angiogram confirms complete stasis of the  left uterine vascular territory. Microcatheter was removed.  The C2 catheter was retracted and utilized to select the right internal iliac artery. Selective right internal iliac angiogram was performed. The patent right uterine artery was identified. For selective catheterization, the micro catheter and guidewire were utilized to select the right uterine artery. Selective right uterine angiogram was performed. This demonstrated patency of the right uterine artery. Catheter position was safe for embolization. Embolization was performed to complete stasis with injection of 2 vials of 500-700 micron Embospheres. Post embolization angiogram confirms complete stasis of the right uterine vascular territory.  At this point, all wires, catheters and sheaths were removed from the patient. Hemostasis was achieved at the right groin access site with  The patient tolerated the procedure well without immediate post procedural complication.  FINDINGS: Selective bilateral uterine artery arteriogram demonstrates diffuse hypervascularity of the enlarged fibroid uterus.  Completion arteriograms following bilateral uterine artery particle embolization demonstrates a technically excellent result with stasis of flow within the bilateral uterine vascular territories.  IMPRESSION: Successful bilateral uterine artery embolization (U F E).  PLAN: Patient will be admitted overnight for continued observation and PCA usage with hope for discharge in the a.m.  The patient will be seen in  follow-up consultation in approximately 1 month. No imaging or laboratories will be necessary for this postprocedural evaluation.   Electronically Signed   By: Sandi Mariscal M.D.   On: 09/10/2014 15:07   Ir US Guide Vasc Access Right  09/10/2014   INDICATION: History of symptomatic uterine fibroids. Patient initially seen in consultation on 05/29/2014 and presents today for fluoroscopic guided bilateral uterine artery embolization.  EXAM: 1. ULTRASOUND GUIDANCE FOR ARTERIAL ACCESS 2. FLUOROSCOPIC GUIDED BILATERAL UTERINE ARTERY EMBOLIZATION  MEDICATIONS: Ancef 2 gm IV; The antibiotic was administered within one hour of the procedure  ANESTHESIA/SEDATION: Fentanyl 100 mcg IV; Versed 6 mg IV; Dilaudid 2 mg IV; Toradol 30 mg IV  Sedation time  70 minutes  CONTRAST:  49mL OMNIPAQUE IOHEXOL 300 MG/ML SOLN, 13mL OMNIPAQUE IOHEXOL 300 MG/ML SOLN  FLUOROSCOPY TIME:  20 minutes 6 seconds (9,480 mGy)  COMPLICATIONS: None immediate  PROCEDURE: Informed consent was obtained from the patient following explanation of the procedure, risks, benefits and alternatives. The patient understands, agrees and consents for the procedure. All questions were addressed. A time out was performed prior to the initiation of the procedure. Maximal barrier sterile technique utilized including caps, mask, sterile gowns, sterile gloves, large sterile drape, hand hygiene, and Betadine prep.  The right femoral head was marked fluoroscopically. Under sterile conditions and local anesthesia, the right common femoral artery access was performed with a micropuncture needle. Under direct ultrasound guidance, the right common femoral was accessed with a micropuncture kit. An ultrasound image was saved for documentation purposes. This allowed for placement of a 5-French vascular sheath. A limited arteriogram was performed through the side arm of the sheath confirming appropriate access within the right common femoral artery.  The 5-French C2 catheter was  utilized to select the contralateral left internal iliac artery. Selective left internal iliac angiogram was performed. The tortuous left uterine artery was identified. Selective catheterization was performed of the left uterine artery with a microcatheter and micro guide wire. A selective left uterine angiogram was performed. This demonstrated patency of the left uterine artery. Mild diffuse hypervascularity of the enlarged fibroid uterus. Access was adequate for embolization. For embolization, 3 vials of 500 - 700 micron Embospheres were injected into the left uterine artery. Post embolization angiogram confirms  complete stasis of the left uterine vascular territory. Microcatheter was removed.  The C2 catheter was retracted and utilized to select the right internal iliac artery. Selective right internal iliac angiogram was performed. The patent right uterine artery was identified. For selective catheterization, the micro catheter and guidewire were utilized to select the right uterine artery. Selective right uterine angiogram was performed. This demonstrated patency of the right uterine artery. Catheter position was safe for embolization. Embolization was performed to complete stasis with injection of 2 vials of 500-700 micron Embospheres. Post embolization angiogram confirms complete stasis of the right uterine vascular territory.  At this point, all wires, catheters and sheaths were removed from the patient. Hemostasis was achieved at the right groin access site with  The patient tolerated the procedure well without immediate post procedural complication.  FINDINGS: Selective bilateral uterine artery arteriogram demonstrates diffuse hypervascularity of the enlarged fibroid uterus.  Completion arteriograms following bilateral uterine artery particle embolization demonstrates a technically excellent result with stasis of flow within the bilateral uterine vascular territories.  IMPRESSION: Successful bilateral  uterine artery embolization (U F E).  PLAN: Patient will be admitted overnight for continued observation and PCA usage with hope for discharge in the a.m.  The patient will be seen in follow-up consultation in approximately 1 month. No imaging or laboratories will be necessary for this postprocedural evaluation.   Electronically Signed   By: Sandi Mariscal M.D.   On: 09/10/2014 15:07   Ir Embo Tumor Organ Ischemia Infarct Inc Guide Roadmapping  09/10/2014   INDICATION: History of symptomatic uterine fibroids. Patient initially seen in consultation on 05/29/2014 and presents today for fluoroscopic guided bilateral uterine artery embolization.  EXAM: 1. ULTRASOUND GUIDANCE FOR ARTERIAL ACCESS 2. FLUOROSCOPIC GUIDED BILATERAL UTERINE ARTERY EMBOLIZATION  MEDICATIONS: Ancef 2 gm IV; The antibiotic was administered within one hour of the procedure  ANESTHESIA/SEDATION: Fentanyl 100 mcg IV; Versed 6 mg IV; Dilaudid 2 mg IV; Toradol 30 mg IV  Sedation time  70 minutes  CONTRAST:  27mL OMNIPAQUE IOHEXOL 300 MG/ML SOLN, 70mL OMNIPAQUE IOHEXOL 300 MG/ML SOLN  FLUOROSCOPY TIME:  20 minutes 6 seconds (4,481 mGy)  COMPLICATIONS: None immediate  PROCEDURE: Informed consent was obtained from the patient following explanation of the procedure, risks, benefits and alternatives. The patient understands, agrees and consents for the procedure. All questions were addressed. A time out was performed prior to the initiation of the procedure. Maximal barrier sterile technique utilized including caps, mask, sterile gowns, sterile gloves, large sterile drape, hand hygiene, and Betadine prep.  The right femoral head was marked fluoroscopically. Under sterile conditions and local anesthesia, the right common femoral artery access was performed with a micropuncture needle. Under direct ultrasound guidance, the right common femoral was accessed with a micropuncture kit. An ultrasound image was saved for documentation purposes. This allowed for  placement of a 5-French vascular sheath. A limited arteriogram was performed through the side arm of the sheath confirming appropriate access within the right common femoral artery.  The 5-French C2 catheter was utilized to select the contralateral left internal iliac artery. Selective left internal iliac angiogram was performed. The tortuous left uterine artery was identified. Selective catheterization was performed of the left uterine artery with a microcatheter and micro guide wire. A selective left uterine angiogram was performed. This demonstrated patency of the left uterine artery. Mild diffuse hypervascularity of the enlarged fibroid uterus. Access was adequate for embolization. For embolization, 3 vials of 500 - 700 micron Embospheres were injected into the  left uterine artery. Post embolization angiogram confirms complete stasis of the left uterine vascular territory. Microcatheter was removed.  The C2 catheter was retracted and utilized to select the right internal iliac artery. Selective right internal iliac angiogram was performed. The patent right uterine artery was identified. For selective catheterization, the micro catheter and guidewire were utilized to select the right uterine artery. Selective right uterine angiogram was performed. This demonstrated patency of the right uterine artery. Catheter position was safe for embolization. Embolization was performed to complete stasis with injection of 2 vials of 500-700 micron Embospheres. Post embolization angiogram confirms complete stasis of the right uterine vascular territory.  At this point, all wires, catheters and sheaths were removed from the patient. Hemostasis was achieved at the right groin access site with  The patient tolerated the procedure well without immediate post procedural complication.  FINDINGS: Selective bilateral uterine artery arteriogram demonstrates diffuse hypervascularity of the enlarged fibroid uterus.  Completion  arteriograms following bilateral uterine artery particle embolization demonstrates a technically excellent result with stasis of flow within the bilateral uterine vascular territories.  IMPRESSION: Successful bilateral uterine artery embolization (U F E).  PLAN: Patient will be admitted overnight for continued observation and PCA usage with hope for discharge in the a.m.  The patient will be seen in follow-up consultation in approximately 1 month. No imaging or laboratories will be necessary for this postprocedural evaluation.   Electronically Signed   By: Sandi Mariscal M.D.   On: 09/10/2014 15:07   Labs:  CBC:  Recent Labs  09/10/14 0859  WBC 3.7*  HGB 11.6*  HCT 36.7  PLT 203    COAGS:  Recent Labs  09/10/14 0859  INR 1.02    BMP:  Recent Labs  09/10/14 0859  NA 140  K 4.1  CL 109  CO2 24  GLUCOSE 106*  BUN 12  CALCIUM 8.9  CREATININE 0.74  GFRNONAA >60  GFRAA >60   Assessment and Plan: Symptomatic uterine fibroids S/p successful UFE  Admit overnight for pain control Discharge in am if stable with 1 month follow-up   Signed: Hedy Jacob 09/10/2014, 3:52 PM

## 2014-09-10 NOTE — Procedures (Signed)
Post UFE.  No immediate complications.  Keep right leg straight for 4 hrs.

## 2014-09-10 NOTE — Discharge Instructions (Signed)
Uterine Artery Embolization for Fibroids, Care After Refer to this sheet in the next few weeks. These instructions provide you with information on caring for yourself after your procedure. Your health care provider may also give you more specific instructions. Your treatment has been planned according to current medical practices, but problems sometimes occur. Call your health care provider if you have any problems or questions after your procedure. WHAT TO EXPECT AFTER THE PROCEDURE After your procedure, it is typical to have cramping in the pelvis. You will be given pain medicine to control it. HOME CARE INSTRUCTIONS  Only take over-the-counter or prescription medicines for pain, discomfort, or fever as directed by your health care provider.  Do not take aspirin. It can cause bleeding.  Follow your health care provider's advice regarding medicines given to you, diet, activity, and when to begin sexual activity.  See your health care provider for follow-up care as directed. SEEK MEDICAL CARE IF:  You have a fever.  You have redness, swelling, and pain around your incision site.  You have pus draining from your incision.  You have a rash. SEEK IMMEDIATE MEDICAL CARE IF:  You have bleeding from your incision site.  You have difficulty breathing.  You have chest pain.  You have severe abdominal pain.  You have leg pain.  You become dizzy and faint. Document Released: 12/28/2012 Document Reviewed: 12/28/2012 [facility] Patient Information 2015 [place], [place]. This information is not intended to replace advice given to you by your health care provider. Make sure you discuss any questions you have with your health care provider. Uterine Artery Embolization for Fibroids Uterine artery embolization is a nonsurgical treatment to shrink fibroids. A thin plastic tube (catheter) is used to inject material that blocks off the blood supply to the fibroid, which causes the fibroid to  shrink. LET [facility] CARE PROVIDER KNOW ABOUT:  Any allergies you have.  All medicines you are taking, including vitamins, herbs, eye drops, creams, and over-the-counter medicines.  Previous problems you or members of your family have had with the use of anesthetics.  Any blood disorders you have.  Previous surgeries you have had.  Medical conditions you have. RISKS AND COMPLICATIONS  Injury to the uterus from decreased blood supply  Infection.  Blood infection (septicemia).  Lack of menstrual periods (amenorrhea).  Death of tissue cells (necrosis) around your bladder or vulva.  Development of a hole between organs or from an organ to the surface of your skin (fistula).  Blood clot in the legs (deep vein thrombosis) or lung (pulmonary embolus). BEFORE THE PROCEDURE  Ask your health care provider about changing or stopping your regular medicines.   Do not take aspirin or blood thinners (anticoagulants) for 1 week before the surgery or as directed by your health care provider.  Do not eat or drink anything for 8 hours before the surgery or as directed by your health care provider.   Empty your bladder before the procedure begins. PROCEDURE   An IV tube will be placed into one of your veins. This will be used to give you a sedative and pain medication (conscious sedation).  You will be given a medicine that numbs the area (local anesthetic).  A small cut will be made in your groin. A catheter is then inserted into the main artery of your leg.  The catheter will be guided through the artery to your uterus. A series of images will be taken while dye is injected through the catheter in your  groin. X-rays are taken at the same time. This is done to provide a road map of the blood supply to your uterus and fibroids.  Tiny plastic spheres, about the size of sand grains, will be injected through the catheter. Metal coils may be used to help block the artery. The particles  will lodge in tiny branches of the uterine artery that supplies blood to the fibroids.  The procedure is repeated on the artery that supplies the other side of the uterus.  The catheter is then removed and pressure is held to stop any bleeding. No stitches are needed.  A dressing is then placed over the cut (incision). AFTER THE PROCEDURE  You will be taken to a recovery area where your progress will be monitored until you are awake, stable, and taking fluids well. If there are no other problems, you will then be moved to a regular hospital room.  You will be observed overnight in the hospital.  You will have cramping that should be controlled with pain medication. Document Released: 05/25/2005 Document Revised: 12/28/2012 Document Reviewed: 09/22/2012 [facility] Patient Information 2015 [place], [place]. This information is not intended to replace advice given to you by your health care provider. Make sure you discuss any questions you have with your health care provider. Conscious Sedation Sedation is the use of medicines to promote relaxation and relieve discomfort and anxiety. Conscious sedation is a type of sedation. Under conscious sedation you are less alert than normal but are still able to respond to instructions or stimulation. Conscious sedation is used during short medical and dental procedures. It is milder than deep sedation or general anesthesia and allows you to return to your regular activities sooner.  LET [facility] CARE PROVIDER KNOW ABOUT:   Any allergies you have.  All medicines you are taking, including vitamins, herbs, eye drops, creams, and over-the-counter medicines.  Use of steroids (by mouth or creams).  Previous problems you or members of your family have had with the use of anesthetics.  Any blood disorders you have.  Previous surgeries you have had.  Medical conditions you have.  Possibility of pregnancy, if this applies.  Use of cigarettes, alcohol,  or illegal drugs. RISKS AND COMPLICATIONS Generally, this is a safe procedure. However, as with any procedure, problems can occur. Possible problems include:  Oversedation.  Trouble breathing on your own. You may need to have a breathing tube until you are awake and breathing on your own.  Allergic reaction to any of the medicines used for the procedure. BEFORE THE PROCEDURE  You may have blood tests done. These tests can help show how well your kidneys and liver are working. They can also show how well your blood clots.  A physical exam will be done.  Only take medicines as directed by your health care provider. You may need to stop taking medicines (such as blood thinners, aspirin, or nonsteroidal anti-inflammatory drugs) before the procedure.   Do not eat or drink at least 6 hours before the procedure or as directed by your health care provider.  Arrange for a responsible adult, family member, or friend to take you home after the procedure. He or she should stay with you for at least 24 hours after the procedure, until the medicine has worn off. PROCEDURE   An intravenous (IV) catheter will be inserted into one of your veins. Medicine will be able to flow directly into your body through this catheter. You may be given medicine through this tube  to help prevent pain and help you relax.  The medical or dental procedure will be done. AFTER THE PROCEDURE  You will stay in a recovery area until the medicine has worn off. Your blood pressure and pulse will be checked.   Depending on the procedure you had, you may be allowed to go home when you can tolerate liquids and your pain is under control. Document Released: 12/02/2000 Document Revised: 03/14/2013 Document Reviewed: 11/14/2012 [facility] Patient Information 2015 [place], [place]. This information is not intended to replace advice given to you by your health care provider. Make sure you discuss any questions you have with your  health care provider.

## 2014-09-10 NOTE — Sedation Documentation (Signed)
Left side Rx begun.

## 2014-09-10 NOTE — H&P (Signed)
Referring Physician(s): Avon Gully  History of Present Illness: Carmen Henderson is a 44 y.o. female who has been seen in consult with IR on 05/29/14 with c/o menorrhagia, dysmenorrhea and migraines since approximately October. She has had a MRI and endometrial biopsy and has been deemed a candidate for uterine fibroid embolization. She rates her current lower pelvic pain 4/10 today. She denies any active spotting, denies any nausea or vomiting. She denies any chest pain, shortness of breath or palpitations. She denies any active signs of bleeding or excessive bruising. She denies any recent fever, chills or urinary symptoms. The patient denies any history of sleep apnea or chronic oxygen use. She denies any known complications to sedation, but has not had it before.    Past Medical History  Diagnosis Date  . Headache   . Migraine   . Fibroids     Past Surgical History  Procedure Laterality Date  . Carpal tunnel release Right   . Knee arthroplasty Right   . Foot surgery Left   . Cesarean section      x2    Allergies: Review of patient's allergies indicates no known allergies.  Medications: Prior to Admission medications   Medication Sig Start Date End Date Taking? Authorizing Provider  aspirin-acetaminophen-caffeine (EXCEDRIN MIGRAINE) [id number] MG per tablet Take 2 tablets by mouth every 6 (six) hours as needed for headache.   Yes Historical Provider, MD  ferrous sulfate 325 (65 FE) MG EC tablet Take 325 mg by mouth daily.   Yes Historical Provider, MD  Multiple Vitamin (MULTIVITAMIN WITH MINERALS) TABS tablet Take 1 tablet by mouth daily.   Yes Historical Provider, MD  rizatriptan (MAXALT) 10 MG tablet Take 10 mg by mouth as needed for migraine. May repeat in 2 hours if needed   Yes Historical Provider, MD  topiramate (TOPAMAX) 25 MG tablet Take 25 mg by mouth at bedtime and may repeat dose one time if needed.   Yes Historical Provider, MD     History reviewed. No  pertinent family history.  History   Social History  . Marital Status: Single    Spouse Name: N/A  . Number of Children: N/A  . Years of Education: N/A   Social History Main Topics  . Smoking status: Never Smoker   . Smokeless tobacco: Never Used  . Alcohol Use: No  . Drug Use: No  . Sexual Activity: Not on file   Other Topics Concern  . None   Social History Narrative   Review of Systems: A 12 point ROS discussed and pertinent positives are indicated in the HPI above.  All other systems are negative.  Review of Systems  Vital Signs: BP 132/95 mmHg  Pulse 90  Temp(Src) 97.7 F (36.5 C) (Oral)  Resp 18  SpO2 98%  Physical Exam  Constitutional: She is oriented to person, place, and time. No distress.  HENT:  Head: Normocephalic and atraumatic.  Neck: No tracheal deviation present.  Cardiovascular: Normal rate, regular rhythm and intact distal pulses.  Exam reveals no gallop and no friction rub.   No murmur heard. Pulmonary/Chest: Effort normal and breath sounds normal. No respiratory distress. She has no wheezes. She has no rales.  Abdominal: Soft. Bowel sounds are normal. She exhibits distension. There is tenderness.  Neurological: She is alert and oriented to person, place, and time.  Skin: Skin is warm and dry. She is not diaphoretic.  Psychiatric: She has a normal mood and affect. Her behavior is normal.  Thought content normal.    Mallampati Score:  MD Evaluation Airway: WNL Heart: WNL Abdomen: WNL Chest/ Lungs: WNL ASA  Classification: 2 Mallampati/Airway Score: Two  Imaging: No results found.  Labs:  CBC:  Recent Labs  09/10/14 0859  WBC 3.7*  HGB 11.6*  HCT 36.7  PLT 203    COAGS:  Recent Labs  09/10/14 0859  INR 1.02    BMP:  Recent Labs  09/10/14 0859  NA 140  K 4.1  CL 109  CO2 24  GLUCOSE 106*  BUN 12  CALCIUM 8.9  CREATININE 0.74  GFRNONAA >60  GFRAA >60    Assessment and Plan: Symptomatic uterine  fibroids Seen in full consult 05/29/14 by IR MRI done, endometrial biopsy without evidence of malignancy Scheduled today for image guided uterine fibroid embolization with sedation and overnight admission for observation and pain control The patient has been NPO, no blood thinners taken, labs and vitals have been reviewed. Risks and Benefits discussed with the patient including, but not limited to bleeding, infection, vascular injury, post procedural pain, nausea, vomiting, fatigue and contrast induced renal failure. All of the patient's questions were answered, patient is agreeable to proceed. Consent signed and in chart.    SignedHedy Jacob 09/10/2014, 10:04 AM

## 2014-09-10 NOTE — Sedation Documentation (Signed)
Right side Rx begun.

## 2014-09-10 NOTE — Sedation Documentation (Addendum)
5Fr sheath removed from R femoral artery by Dr. Watts.  Hemostasis achieved using manual pressure. Groin level 0, 2+RDP. 

## 2014-09-11 ENCOUNTER — Other Ambulatory Visit: Payer: Self-pay | Admitting: Radiology

## 2014-09-11 DIAGNOSIS — D259 Leiomyoma of uterus, unspecified: Secondary | ICD-10-CM | POA: Diagnosis not present

## 2014-09-11 MED ORDER — HYDROCODONE-ACETAMINOPHEN 5-325 MG PO TABS
1.0000 | ORAL_TABLET | ORAL | Status: DC | PRN
  Administered 2014-09-11: 1 via ORAL
  Filled 2014-09-11: qty 1

## 2014-09-11 NOTE — Discharge Instructions (Signed)
Uterine Artery Embolization for Fibroids, Care After Refer to this sheet in the next few weeks. These instructions provide you with information on caring for yourself after your procedure. Your health care provider may also give you more specific instructions. Your treatment has been planned according to current medical practices, but problems sometimes occur. Call your health care provider if you have any problems or questions after your procedure. WHAT TO EXPECT AFTER THE PROCEDURE After your procedure, it is typical to have cramping in the pelvis. You will be given pain medicine to control it. HOME CARE INSTRUCTIONS  Only take over-the-counter or prescription medicines for pain, discomfort, or fever as directed by your health care provider.  Do not take aspirin. It can cause bleeding.  Follow your health care provider's advice regarding medicines given to you, diet, activity, and when to begin sexual activity.  See your health care provider for follow-up care as directed. SEEK MEDICAL CARE IF:  You have a fever.  You have redness, swelling, and pain around your incision site.  You have pus draining from your incision.  You have a rash. SEEK IMMEDIATE MEDICAL CARE IF:  You have bleeding from your incision site.  You have difficulty breathing.  You have chest pain.  You have severe abdominal pain.  You have leg pain.  You become dizzy and faint. Document Released: 12/28/2012 Document Reviewed: 12/28/2012 [facility] Patient Information 2015 [place], [place]. This information is not intended to replace advice given to you by your health care provider. Make sure you discuss any questions you have with your health care provider.

## 2014-09-11 NOTE — Progress Notes (Signed)
Patient d/c instructions given and also prescriptions handed to patient,.verbalized understanding. Patient d/c home. Dsg to R groin CDI.

## 2014-09-11 NOTE — Discharge Summary (Signed)
Patient ID: Carmen Henderson MRN: [id number] DOB/AGE: [id number] y.o.  Admit date: 09/10/2014 Discharge date: 09/11/2014  Admission Diagnoses: Symptomatic uterine fibroids  Discharge Diagnoses: Symptomatic uterine fibroids, status post bilateral uterine artery embolization on 09/10/14 Active Problems:   Fibroid  Past Medical History  Diagnosis Date  . Headache   . Migraine   . Fibroids    Past Surgical History  Procedure Laterality Date  . Carpal tunnel release Right   . Knee arthroplasty Right   . Foot surgery Left   . Cesarean section      x2      Discharged Condition: good  Hospital Course: Ms. Norberto Sorenson is a 44 year old black female, patient of Avon Gully, NP, who was referred to the interventional radiology service secondary to symptomatic uterine fibroids. The patient was seen in consultation by Dr. Pascal Lux and deemed a suitable candidate for bilateral uterine artery embolization. On 09/10/14 the patient underwent successful bilateral uterine artery embolization via IV conscious sedation. The procedure was performed without immediate complications and the patient was subsequently admitted to the hospital for overnight observation. She was placed on a Dilaudid PCA pump and given antiemetics as needed. Overnight the patient did well with exception of intermittent pelvic cramping as expected. On the day of discharge the patient was able to ambulate, void and tolerate her diet without difficulty. She continued to have some mild pelvic cramping. She was seen by Dr. Earleen Newport and deemed stable for discharge at this time. She was given prescriptions for ibuprofen 600 mg, #20, no refills, 1 tablet every 6 hours for the next 5 days. Zofran 8 mg, #10, no refills, 1 tablet twice daily as needed for nausea. Vicodin 5/325, #30, no refills, 1-2 tablets every 4-6 hours as needed for pain. Colace 100 mg, #20, no refills, 1 tablet twice daily as needed for constipation. She will be scheduled for  follow-up with Dr. Pascal Lux in the interventional radiology clinic in one month. She will continue her current home medications and continue follow up with her gynecologic nurse practitioner. She was told to contact our service in the interim with any additional questions or concerns.  Consults: none  Significant Diagnostic Studies:  Results for orders placed or performed during the hospital encounter of 84/53/64  Basic metabolic panel  Result Value Ref Range   Sodium 140 135 - 145 mmol/L   Potassium 4.1 3.5 - 5.1 mmol/L   Chloride 109 101 - 111 mmol/L   CO2 24 22 - 32 mmol/L   Glucose, Bld 106 (H) 65 - 99 mg/dL   BUN 12 6 - 20 mg/dL   Creatinine, Ser 0.74 0.44 - 1.00 mg/dL   Calcium 8.9 8.9 - 10.3 mg/dL   GFR calc non Af Amer >60 >60 mL/min   GFR calc Af Amer >60 >60 mL/min   Anion gap 7 5 - 15  CBC with Differential/Platelet  Result Value Ref Range   WBC 3.7 (L) 4.0 - 10.5 K/uL   RBC 3.94 3.87 - 5.11 MIL/uL   Hemoglobin 11.6 (L) 12.0 - 15.0 g/dL   HCT 36.7 36.0 - 46.0 %   MCV 93.1 78.0 - 100.0 fL   MCH 29.4 26.0 - 34.0 pg   MCHC 31.6 30.0 - 36.0 g/dL   RDW 14.5 11.5 - 15.5 %   Platelets 203 150 - 400 K/uL   Neutrophils Relative % 51 43 - 77 %   Neutro Abs 1.9 1.7 - 7.7 K/uL   Lymphocytes Relative 42 12 -  46 %   Lymphs Abs 1.5 0.7 - 4.0 K/uL   Monocytes Relative 7 3 - 12 %   Monocytes Absolute 0.3 0.1 - 1.0 K/uL   Eosinophils Relative 0 0 - 5 %   Eosinophils Absolute 0.0 0.0 - 0.7 K/uL   Basophils Relative 0 0 - 1 %   Basophils Absolute 0.0 0.0 - 0.1 K/uL  hCG, serum, qualitative  Result Value Ref Range   Preg, Serum NEGATIVE NEGATIVE  Protime-INR  Result Value Ref Range   Prothrombin Time 13.6 11.6 - 15.2 seconds   INR 1.02 0.00 - 1.49     Treatments: Ir Angiogram Pelvis Selective Or Supraselective  09/10/2014   INDICATION: History of symptomatic uterine fibroids. Patient initially seen in consultation on 05/29/2014 and presents today for fluoroscopic guided  bilateral uterine artery embolization.  EXAM: 1. ULTRASOUND GUIDANCE FOR ARTERIAL ACCESS 2. FLUOROSCOPIC GUIDED BILATERAL UTERINE ARTERY EMBOLIZATION  MEDICATIONS: Ancef 2 gm IV; The antibiotic was administered within one hour of the procedure  ANESTHESIA/SEDATION: Fentanyl 100 mcg IV; Versed 6 mg IV; Dilaudid 2 mg IV; Toradol 30 mg IV  Sedation time  70 minutes  CONTRAST:  56m OMNIPAQUE IOHEXOL 300 MG/ML SOLN, 231mOMNIPAQUE IOHEXOL 300 MG/ML SOLN  FLUOROSCOPY TIME:  20 minutes 6 seconds (3,0,626Gy)  COMPLICATIONS: None immediate  PROCEDURE: Informed consent was obtained from the patient following explanation of the procedure, risks, benefits and alternatives. The patient understands, agrees and consents for the procedure. All questions were addressed. A time out was performed prior to the initiation of the procedure. Maximal barrier sterile technique utilized including caps, mask, sterile gowns, sterile gloves, large sterile drape, hand hygiene, and Betadine prep.  The right femoral head was marked fluoroscopically. Under sterile conditions and local anesthesia, the right common femoral artery access was performed with a micropuncture needle. Under direct ultrasound guidance, the right common femoral was accessed with a micropuncture kit. An ultrasound image was saved for documentation purposes. This allowed for placement of a 5-French vascular sheath. A limited arteriogram was performed through the side arm of the sheath confirming appropriate access within the right common femoral artery.  The 5-French C2 catheter was utilized to select the contralateral left internal iliac artery. Selective left internal iliac angiogram was performed. The tortuous left uterine artery was identified. Selective catheterization was performed of the left uterine artery with a microcatheter and micro guide wire. A selective left uterine angiogram was performed. This demonstrated patency of the left uterine artery. Mild diffuse  hypervascularity of the enlarged fibroid uterus. Access was adequate for embolization. For embolization, 3 vials of 500 - 700 micron Embospheres were injected into the left uterine artery. Post embolization angiogram confirms complete stasis of the left uterine vascular territory. Microcatheter was removed.  The C2 catheter was retracted and utilized to select the right internal iliac artery. Selective right internal iliac angiogram was performed. The patent right uterine artery was identified. For selective catheterization, the micro catheter and guidewire were utilized to select the right uterine artery. Selective right uterine angiogram was performed. This demonstrated patency of the right uterine artery. Catheter position was safe for embolization. Embolization was performed to complete stasis with injection of 2 vials of 500-700 micron Embospheres. Post embolization angiogram confirms complete stasis of the right uterine vascular territory.  At this point, all wires, catheters and sheaths were removed from the patient. Hemostasis was achieved at the right groin access site with  The patient tolerated the procedure well without immediate post procedural  complication.  FINDINGS: Selective bilateral uterine artery arteriogram demonstrates diffuse hypervascularity of the enlarged fibroid uterus.  Completion arteriograms following bilateral uterine artery particle embolization demonstrates a technically excellent result with stasis of flow within the bilateral uterine vascular territories.  IMPRESSION: Successful bilateral uterine artery embolization (U F E).  PLAN: Patient will be admitted overnight for continued observation and PCA usage with hope for discharge in the a.m.  The patient will be seen in follow-up consultation in approximately 1 month. No imaging or laboratories will be necessary for this postprocedural evaluation.   Electronically Signed   By: Sandi Mariscal M.D.   On: 09/10/2014 15:07   Ir Angiogram  Pelvis Selective Or Supraselective  09/10/2014   INDICATION: History of symptomatic uterine fibroids. Patient initially seen in consultation on 05/29/2014 and presents today for fluoroscopic guided bilateral uterine artery embolization.  EXAM: 1. ULTRASOUND GUIDANCE FOR ARTERIAL ACCESS 2. FLUOROSCOPIC GUIDED BILATERAL UTERINE ARTERY EMBOLIZATION  MEDICATIONS: Ancef 2 gm IV; The antibiotic was administered within one hour of the procedure  ANESTHESIA/SEDATION: Fentanyl 100 mcg IV; Versed 6 mg IV; Dilaudid 2 mg IV; Toradol 30 mg IV  Sedation time  70 minutes  CONTRAST:  45m OMNIPAQUE IOHEXOL 300 MG/ML SOLN, 241mOMNIPAQUE IOHEXOL 300 MG/ML SOLN  FLUOROSCOPY TIME:  20 minutes 6 seconds (3,3,557Gy)  COMPLICATIONS: None immediate  PROCEDURE: Informed consent was obtained from the patient following explanation of the procedure, risks, benefits and alternatives. The patient understands, agrees and consents for the procedure. All questions were addressed. A time out was performed prior to the initiation of the procedure. Maximal barrier sterile technique utilized including caps, mask, sterile gowns, sterile gloves, large sterile drape, hand hygiene, and Betadine prep.  The right femoral head was marked fluoroscopically. Under sterile conditions and local anesthesia, the right common femoral artery access was performed with a micropuncture needle. Under direct ultrasound guidance, the right common femoral was accessed with a micropuncture kit. An ultrasound image was saved for documentation purposes. This allowed for placement of a 5-French vascular sheath. A limited arteriogram was performed through the side arm of the sheath confirming appropriate access within the right common femoral artery.  The 5-French C2 catheter was utilized to select the contralateral left internal iliac artery. Selective left internal iliac angiogram was performed. The tortuous left uterine artery was identified. Selective catheterization was  performed of the left uterine artery with a microcatheter and micro guide wire. A selective left uterine angiogram was performed. This demonstrated patency of the left uterine artery. Mild diffuse hypervascularity of the enlarged fibroid uterus. Access was adequate for embolization. For embolization, 3 vials of 500 - 700 micron Embospheres were injected into the left uterine artery. Post embolization angiogram confirms complete stasis of the left uterine vascular territory. Microcatheter was removed.  The C2 catheter was retracted and utilized to select the right internal iliac artery. Selective right internal iliac angiogram was performed. The patent right uterine artery was identified. For selective catheterization, the micro catheter and guidewire were utilized to select the right uterine artery. Selective right uterine angiogram was performed. This demonstrated patency of the right uterine artery. Catheter position was safe for embolization. Embolization was performed to complete stasis with injection of 2 vials of 500-700 micron Embospheres. Post embolization angiogram confirms complete stasis of the right uterine vascular territory.  At this point, all wires, catheters and sheaths were removed from the patient. Hemostasis was achieved at the right groin access site with  The patient tolerated the procedure well  without immediate post procedural complication.  FINDINGS: Selective bilateral uterine artery arteriogram demonstrates diffuse hypervascularity of the enlarged fibroid uterus.  Completion arteriograms following bilateral uterine artery particle embolization demonstrates a technically excellent result with stasis of flow within the bilateral uterine vascular territories.  IMPRESSION: Successful bilateral uterine artery embolization (U F E).  PLAN: Patient will be admitted overnight for continued observation and PCA usage with hope for discharge in the a.m.  The patient will be seen in follow-up  consultation in approximately 1 month. No imaging or laboratories will be necessary for this postprocedural evaluation.   Electronically Signed   By: Sandi Mariscal M.D.   On: 09/10/2014 15:07   Ir Angiogram Selective Each Additional Vessel  09/10/2014   INDICATION: History of symptomatic uterine fibroids. Patient initially seen in consultation on 05/29/2014 and presents today for fluoroscopic guided bilateral uterine artery embolization.  EXAM: 1. ULTRASOUND GUIDANCE FOR ARTERIAL ACCESS 2. FLUOROSCOPIC GUIDED BILATERAL UTERINE ARTERY EMBOLIZATION  MEDICATIONS: Ancef 2 gm IV; The antibiotic was administered within one hour of the procedure  ANESTHESIA/SEDATION: Fentanyl 100 mcg IV; Versed 6 mg IV; Dilaudid 2 mg IV; Toradol 30 mg IV  Sedation time  70 minutes  CONTRAST:  64m OMNIPAQUE IOHEXOL 300 MG/ML SOLN, 27mOMNIPAQUE IOHEXOL 300 MG/ML SOLN  FLUOROSCOPY TIME:  20 minutes 6 seconds (3,5,465Gy)  COMPLICATIONS: None immediate  PROCEDURE: Informed consent was obtained from the patient following explanation of the procedure, risks, benefits and alternatives. The patient understands, agrees and consents for the procedure. All questions were addressed. A time out was performed prior to the initiation of the procedure. Maximal barrier sterile technique utilized including caps, mask, sterile gowns, sterile gloves, large sterile drape, hand hygiene, and Betadine prep.  The right femoral head was marked fluoroscopically. Under sterile conditions and local anesthesia, the right common femoral artery access was performed with a micropuncture needle. Under direct ultrasound guidance, the right common femoral was accessed with a micropuncture kit. An ultrasound image was saved for documentation purposes. This allowed for placement of a 5-French vascular sheath. A limited arteriogram was performed through the side arm of the sheath confirming appropriate access within the right common femoral artery.  The 5-French C2 catheter  was utilized to select the contralateral left internal iliac artery. Selective left internal iliac angiogram was performed. The tortuous left uterine artery was identified. Selective catheterization was performed of the left uterine artery with a microcatheter and micro guide wire. A selective left uterine angiogram was performed. This demonstrated patency of the left uterine artery. Mild diffuse hypervascularity of the enlarged fibroid uterus. Access was adequate for embolization. For embolization, 3 vials of 500 - 700 micron Embospheres were injected into the left uterine artery. Post embolization angiogram confirms complete stasis of the left uterine vascular territory. Microcatheter was removed.  The C2 catheter was retracted and utilized to select the right internal iliac artery. Selective right internal iliac angiogram was performed. The patent right uterine artery was identified. For selective catheterization, the micro catheter and guidewire were utilized to select the right uterine artery. Selective right uterine angiogram was performed. This demonstrated patency of the right uterine artery. Catheter position was safe for embolization. Embolization was performed to complete stasis with injection of 2 vials of 500-700 micron Embospheres. Post embolization angiogram confirms complete stasis of the right uterine vascular territory.  At this point, all wires, catheters and sheaths were removed from the patient. Hemostasis was achieved at the right groin access site with  The patient  tolerated the procedure well without immediate post procedural complication.  FINDINGS: Selective bilateral uterine artery arteriogram demonstrates diffuse hypervascularity of the enlarged fibroid uterus.  Completion arteriograms following bilateral uterine artery particle embolization demonstrates a technically excellent result with stasis of flow within the bilateral uterine vascular territories.  IMPRESSION: Successful bilateral  uterine artery embolization (U F E).  PLAN: Patient will be admitted overnight for continued observation and PCA usage with hope for discharge in the a.m.  The patient will be seen in follow-up consultation in approximately 1 month. No imaging or laboratories will be necessary for this postprocedural evaluation.   Electronically Signed   By: Sandi Mariscal M.D.   On: 09/10/2014 15:07   Ir Angiogram Selective Each Additional Vessel  09/10/2014   INDICATION: History of symptomatic uterine fibroids. Patient initially seen in consultation on 05/29/2014 and presents today for fluoroscopic guided bilateral uterine artery embolization.  EXAM: 1. ULTRASOUND GUIDANCE FOR ARTERIAL ACCESS 2. FLUOROSCOPIC GUIDED BILATERAL UTERINE ARTERY EMBOLIZATION  MEDICATIONS: Ancef 2 gm IV; The antibiotic was administered within one hour of the procedure  ANESTHESIA/SEDATION: Fentanyl 100 mcg IV; Versed 6 mg IV; Dilaudid 2 mg IV; Toradol 30 mg IV  Sedation time  70 minutes  CONTRAST:  79m OMNIPAQUE IOHEXOL 300 MG/ML SOLN, 222mOMNIPAQUE IOHEXOL 300 MG/ML SOLN  FLUOROSCOPY TIME:  20 minutes 6 seconds (3,8,119Gy)  COMPLICATIONS: None immediate  PROCEDURE: Informed consent was obtained from the patient following explanation of the procedure, risks, benefits and alternatives. The patient understands, agrees and consents for the procedure. All questions were addressed. A time out was performed prior to the initiation of the procedure. Maximal barrier sterile technique utilized including caps, mask, sterile gowns, sterile gloves, large sterile drape, hand hygiene, and Betadine prep.  The right femoral head was marked fluoroscopically. Under sterile conditions and local anesthesia, the right common femoral artery access was performed with a micropuncture needle. Under direct ultrasound guidance, the right common femoral was accessed with a micropuncture kit. An ultrasound image was saved for documentation purposes. This allowed for placement of a  5-French vascular sheath. A limited arteriogram was performed through the side arm of the sheath confirming appropriate access within the right common femoral artery.  The 5-French C2 catheter was utilized to select the contralateral left internal iliac artery. Selective left internal iliac angiogram was performed. The tortuous left uterine artery was identified. Selective catheterization was performed of the left uterine artery with a microcatheter and micro guide wire. A selective left uterine angiogram was performed. This demonstrated patency of the left uterine artery. Mild diffuse hypervascularity of the enlarged fibroid uterus. Access was adequate for embolization. For embolization, 3 vials of 500 - 700 micron Embospheres were injected into the left uterine artery. Post embolization angiogram confirms complete stasis of the left uterine vascular territory. Microcatheter was removed.  The C2 catheter was retracted and utilized to select the right internal iliac artery. Selective right internal iliac angiogram was performed. The patent right uterine artery was identified. For selective catheterization, the micro catheter and guidewire were utilized to select the right uterine artery. Selective right uterine angiogram was performed. This demonstrated patency of the right uterine artery. Catheter position was safe for embolization. Embolization was performed to complete stasis with injection of 2 vials of 500-700 micron Embospheres. Post embolization angiogram confirms complete stasis of the right uterine vascular territory.  At this point, all wires, catheters and sheaths were removed from the patient. Hemostasis was achieved at the right groin access site  with  The patient tolerated the procedure well without immediate post procedural complication.  FINDINGS: Selective bilateral uterine artery arteriogram demonstrates diffuse hypervascularity of the enlarged fibroid uterus.  Completion arteriograms following  bilateral uterine artery particle embolization demonstrates a technically excellent result with stasis of flow within the bilateral uterine vascular territories.  IMPRESSION: Successful bilateral uterine artery embolization (U F E).  PLAN: Patient will be admitted overnight for continued observation and PCA usage with hope for discharge in the a.m.  The patient will be seen in follow-up consultation in approximately 1 month. No imaging or laboratories will be necessary for this postprocedural evaluation.   Electronically Signed   By: Sandi Mariscal M.D.   On: 09/10/2014 15:07   Ir US Guide Vasc Access Right  09/10/2014   INDICATION: History of symptomatic uterine fibroids. Patient initially seen in consultation on 05/29/2014 and presents today for fluoroscopic guided bilateral uterine artery embolization.  EXAM: 1. ULTRASOUND GUIDANCE FOR ARTERIAL ACCESS 2. FLUOROSCOPIC GUIDED BILATERAL UTERINE ARTERY EMBOLIZATION  MEDICATIONS: Ancef 2 gm IV; The antibiotic was administered within one hour of the procedure  ANESTHESIA/SEDATION: Fentanyl 100 mcg IV; Versed 6 mg IV; Dilaudid 2 mg IV; Toradol 30 mg IV  Sedation time  70 minutes  CONTRAST:  60m OMNIPAQUE IOHEXOL 300 MG/ML SOLN, 262mOMNIPAQUE IOHEXOL 300 MG/ML SOLN  FLUOROSCOPY TIME:  20 minutes 6 seconds (3,7,903Gy)  COMPLICATIONS: None immediate  PROCEDURE: Informed consent was obtained from the patient following explanation of the procedure, risks, benefits and alternatives. The patient understands, agrees and consents for the procedure. All questions were addressed. A time out was performed prior to the initiation of the procedure. Maximal barrier sterile technique utilized including caps, mask, sterile gowns, sterile gloves, large sterile drape, hand hygiene, and Betadine prep.  The right femoral head was marked fluoroscopically. Under sterile conditions and local anesthesia, the right common femoral artery access was performed with a micropuncture needle. Under  direct ultrasound guidance, the right common femoral was accessed with a micropuncture kit. An ultrasound image was saved for documentation purposes. This allowed for placement of a 5-French vascular sheath. A limited arteriogram was performed through the side arm of the sheath confirming appropriate access within the right common femoral artery.  The 5-French C2 catheter was utilized to select the contralateral left internal iliac artery. Selective left internal iliac angiogram was performed. The tortuous left uterine artery was identified. Selective catheterization was performed of the left uterine artery with a microcatheter and micro guide wire. A selective left uterine angiogram was performed. This demonstrated patency of the left uterine artery. Mild diffuse hypervascularity of the enlarged fibroid uterus. Access was adequate for embolization. For embolization, 3 vials of 500 - 700 micron Embospheres were injected into the left uterine artery. Post embolization angiogram confirms complete stasis of the left uterine vascular territory. Microcatheter was removed.  The C2 catheter was retracted and utilized to select the right internal iliac artery. Selective right internal iliac angiogram was performed. The patent right uterine artery was identified. For selective catheterization, the micro catheter and guidewire were utilized to select the right uterine artery. Selective right uterine angiogram was performed. This demonstrated patency of the right uterine artery. Catheter position was safe for embolization. Embolization was performed to complete stasis with injection of 2 vials of 500-700 micron Embospheres. Post embolization angiogram confirms complete stasis of the right uterine vascular territory.  At this point, all wires, catheters and sheaths were removed from the patient. Hemostasis was achieved at the  right groin access site with  The patient tolerated the procedure well without immediate post  procedural complication.  FINDINGS: Selective bilateral uterine artery arteriogram demonstrates diffuse hypervascularity of the enlarged fibroid uterus.  Completion arteriograms following bilateral uterine artery particle embolization demonstrates a technically excellent result with stasis of flow within the bilateral uterine vascular territories.  IMPRESSION: Successful bilateral uterine artery embolization (U F E).  PLAN: Patient will be admitted overnight for continued observation and PCA usage with hope for discharge in the a.m.  The patient will be seen in follow-up consultation in approximately 1 month. No imaging or laboratories will be necessary for this postprocedural evaluation.   Electronically Signed   By: Sandi Mariscal M.D.   On: 09/10/2014 15:07   Ir Embo Tumor Organ Ischemia Infarct Inc Guide Roadmapping  09/10/2014   INDICATION: History of symptomatic uterine fibroids. Patient initially seen in consultation on 05/29/2014 and presents today for fluoroscopic guided bilateral uterine artery embolization.  EXAM: 1. ULTRASOUND GUIDANCE FOR ARTERIAL ACCESS 2. FLUOROSCOPIC GUIDED BILATERAL UTERINE ARTERY EMBOLIZATION  MEDICATIONS: Ancef 2 gm IV; The antibiotic was administered within one hour of the procedure  ANESTHESIA/SEDATION: Fentanyl 100 mcg IV; Versed 6 mg IV; Dilaudid 2 mg IV; Toradol 30 mg IV  Sedation time  70 minutes  CONTRAST:  75m OMNIPAQUE IOHEXOL 300 MG/ML SOLN, 21mOMNIPAQUE IOHEXOL 300 MG/ML SOLN  FLUOROSCOPY TIME:  20 minutes 6 seconds (3,1,470Gy)  COMPLICATIONS: None immediate  PROCEDURE: Informed consent was obtained from the patient following explanation of the procedure, risks, benefits and alternatives. The patient understands, agrees and consents for the procedure. All questions were addressed. A time out was performed prior to the initiation of the procedure. Maximal barrier sterile technique utilized including caps, mask, sterile gowns, sterile gloves, large sterile drape, hand  hygiene, and Betadine prep.  The right femoral head was marked fluoroscopically. Under sterile conditions and local anesthesia, the right common femoral artery access was performed with a micropuncture needle. Under direct ultrasound guidance, the right common femoral was accessed with a micropuncture kit. An ultrasound image was saved for documentation purposes. This allowed for placement of a 5-French vascular sheath. A limited arteriogram was performed through the side arm of the sheath confirming appropriate access within the right common femoral artery.  The 5-French C2 catheter was utilized to select the contralateral left internal iliac artery. Selective left internal iliac angiogram was performed. The tortuous left uterine artery was identified. Selective catheterization was performed of the left uterine artery with a microcatheter and micro guide wire. A selective left uterine angiogram was performed. This demonstrated patency of the left uterine artery. Mild diffuse hypervascularity of the enlarged fibroid uterus. Access was adequate for embolization. For embolization, 3 vials of 500 - 700 micron Embospheres were injected into the left uterine artery. Post embolization angiogram confirms complete stasis of the left uterine vascular territory. Microcatheter was removed.  The C2 catheter was retracted and utilized to select the right internal iliac artery. Selective right internal iliac angiogram was performed. The patent right uterine artery was identified. For selective catheterization, the micro catheter and guidewire were utilized to select the right uterine artery. Selective right uterine angiogram was performed. This demonstrated patency of the right uterine artery. Catheter position was safe for embolization. Embolization was performed to complete stasis with injection of 2 vials of 500-700 micron Embospheres. Post embolization angiogram confirms complete stasis of the right uterine vascular  territory.  At this point, all wires, catheters and sheaths were removed  from the patient. Hemostasis was achieved at the right groin access site with  The patient tolerated the procedure well without immediate post procedural complication.  FINDINGS: Selective bilateral uterine artery arteriogram demonstrates diffuse hypervascularity of the enlarged fibroid uterus.  Completion arteriograms following bilateral uterine artery particle embolization demonstrates a technically excellent result with stasis of flow within the bilateral uterine vascular territories.  IMPRESSION: Successful bilateral uterine artery embolization (U F E).  PLAN: Patient will be admitted overnight for continued observation and PCA usage with hope for discharge in the a.m.  The patient will be seen in follow-up consultation in approximately 1 month. No imaging or laboratories will be necessary for this postprocedural evaluation.   Electronically Signed   By: Sandi Mariscal M.D.   On: 09/10/2014 15:07     Discharge Exam: Blood pressure 128/81, pulse 98, temperature 97.8 F (36.6 C), temperature source Oral, resp. rate 15, height _0  (1.499 m), weight 252 lb 6.4 oz (114.488 kg), SpO2 100 %. Patient awake, alert. Chest clear to auscultation bilaterally. Heart with regular rate and rhythm. Abdomen obese, soft, positive bowel sounds, mild mid pelvic tenderness to palpation. Puncture site right common femoral artery clean, dry, soft, nontender, no hematoma. Extremities with full range of motion, intact distal pulses and no significant edema.  Disposition: home  Discharge Instructions    Call MD for:  difficulty breathing, headache or visual disturbances    Complete by:  As directed      Call MD for:  extreme fatigue    Complete by:  As directed      Call MD for:  hives    Complete by:  As directed      Call MD for:  persistant dizziness or light-headedness    Complete by:  As directed      Call MD for:  persistant nausea and  vomiting    Complete by:  As directed      Call MD for:  redness, tenderness, or signs of infection (pain, swelling, redness, odor or green/yellow discharge around incision site)    Complete by:  As directed      Call MD for:  severe uncontrolled pain    Complete by:  As directed      Call MD for:  temperature >100.4    Complete by:  As directed      Change dressing (specify)    Complete by:  As directed   May change bandage over right groin daily for next 2-3 days and wash site with soap and water     Diet - low sodium heart healthy    Complete by:  As directed      Discharge instructions    Complete by:  As directed   Avoid taking Excedrin while on ibuprofen therapy.     Driving Restrictions    Complete by:  As directed   No driving for next 48 hours     Increase activity slowly    Complete by:  As directed      Lifting restrictions    Complete by:  As directed   No heavy lifting for the next 3-4 days.     May shower / Bathe    Complete by:  As directed      May walk up steps    Complete by:  As directed      Sexual Activity Restrictions    Complete by:  As directed   No sexual intercourse for 1 week  Medication List    TAKE these medications        EXCEDRIN MIGRAINE 250-250-65 MG per tablet  Generic drug:  aspirin-acetaminophen-caffeine  Take 2 tablets by mouth every 6 (six) hours as needed for headache.     ferrous sulfate 325 (65 FE) MG EC tablet  Take 325 mg by mouth daily.     multivitamin with minerals Tabs tablet  Take 1 tablet by mouth daily.     rizatriptan 10 MG tablet  Commonly known as:  MAXALT  Take 10 mg by mouth as needed for migraine. May repeat in 2 hours if needed     topiramate 25 MG tablet  Commonly known as:  TOPAMAX  Take 25 mg by mouth at bedtime and may repeat dose one time if needed.           Follow-up Information    Follow up with Sandi Mariscal, MD.   Specialty:  Interventional Radiology   Why:  Radiology will call  you with follow up appointment with Dr. Pascal Lux in approximately 1 month; call [phone] or [phone] with any additional questions or concerns.   Contact information:   [address] [place] [address] [phone]       Follow up with [facility], NP.   Specialty:  Obstetrics and Gynecology   Why:  Continue current gynecologic care with your nurse practitioner as scheduled   Contact information:   [place] [id number] [phone]        Signed: D. Rowe Robert 09/11/2014, 9:51 AM   I have spent Less Than 30 Minutes discharging [name].

## 2014-09-12 ENCOUNTER — Other Ambulatory Visit (HOSPITAL_COMMUNITY): Payer: Self-pay | Admitting: Interventional Radiology

## 2014-09-12 DIAGNOSIS — D259 Leiomyoma of uterus, unspecified: Secondary | ICD-10-CM

## 2014-09-18 ENCOUNTER — Encounter: Payer: Self-pay | Admitting: Radiology

## 2014-10-17 ENCOUNTER — Other Ambulatory Visit: Payer: BLUE CROSS/BLUE SHIELD

## 2014-11-21 ENCOUNTER — Inpatient Hospital Stay: Admission: RE | Admit: 2014-11-21 | Payer: BLUE CROSS/BLUE SHIELD | Source: Ambulatory Visit

## 2014-12-19 ENCOUNTER — Emergency Department (HOSPITAL_COMMUNITY)
Admission: EM | Admit: 2014-12-19 | Discharge: 2014-12-19 | Discharge status: Against Medical Advice | Payer: BLUE CROSS/BLUE SHIELD | Attending: Emergency Medicine | Admitting: Emergency Medicine

## 2014-12-19 ENCOUNTER — Encounter (HOSPITAL_COMMUNITY): Payer: Self-pay | Admitting: Emergency Medicine

## 2014-12-19 DIAGNOSIS — N939 Abnormal uterine and vaginal bleeding, unspecified: Secondary | ICD-10-CM | POA: Insufficient documentation

## 2014-12-19 NOTE — ED Notes (Signed)
Called 2x for pt - no answer

## 2014-12-19 NOTE — ED Notes (Signed)
Pt refused bloodwork at this time, stated her PCP was suppose to send results from todays lab work to the MD here.

## 2014-12-19 NOTE — ED Notes (Signed)
Per patient states vaginal bleeding since this am-has history of uterine fibroids-saw PCP and they since her here

## 2015-01-23 ENCOUNTER — Other Ambulatory Visit: Payer: Self-pay

## 2015-01-23 DIAGNOSIS — Z1231 Encounter for screening mammogram for malignant neoplasm of breast: Secondary | ICD-10-CM

## 2015-02-13 ENCOUNTER — Other Ambulatory Visit (HOSPITAL_COMMUNITY): Payer: Self-pay | Admitting: Interventional Radiology

## 2015-02-13 DIAGNOSIS — D259 Leiomyoma of uterus, unspecified: Secondary | ICD-10-CM

## 2015-02-25 DIAGNOSIS — G5602 Carpal tunnel syndrome, left upper limb: Secondary | ICD-10-CM | POA: Insufficient documentation

## 2015-02-26 ENCOUNTER — Encounter: Payer: Self-pay | Admitting: Radiology

## 2015-02-28 ENCOUNTER — Ambulatory Visit: Payer: BLUE CROSS/BLUE SHIELD

## 2015-04-08 ENCOUNTER — Ambulatory Visit: Payer: BLUE CROSS/BLUE SHIELD

## 2015-07-09 DIAGNOSIS — G43829 Menstrual migraine, not intractable, without status migrainosus: Secondary | ICD-10-CM | POA: Insufficient documentation

## 2015-09-18 DIAGNOSIS — K219 Gastro-esophageal reflux disease without esophagitis: Secondary | ICD-10-CM | POA: Insufficient documentation

## 2015-09-18 DIAGNOSIS — R490 Dysphonia: Secondary | ICD-10-CM | POA: Insufficient documentation

## 2016-03-25 ENCOUNTER — Encounter: Payer: Self-pay | Admitting: Interventional Radiology

## 2016-08-18 ENCOUNTER — Other Ambulatory Visit: Payer: Self-pay | Admitting: Obstetrics and Gynecology

## 2016-08-18 DIAGNOSIS — N63 Unspecified lump in unspecified breast: Secondary | ICD-10-CM

## 2016-08-20 ENCOUNTER — Ambulatory Visit
Admission: RE | Admit: 2016-08-20 | Discharge: 2016-08-20 | Disposition: A | Discharge status: Home / Self Care | Payer: No Typology Code available for payment source | Source: Ambulatory Visit | Attending: Obstetrics and Gynecology | Admitting: Obstetrics and Gynecology

## 2016-08-20 ENCOUNTER — Other Ambulatory Visit: Payer: Self-pay | Admitting: Obstetrics and Gynecology

## 2016-08-20 DIAGNOSIS — N63 Unspecified lump in unspecified breast: Secondary | ICD-10-CM

## 2016-12-25 DIAGNOSIS — E0590 Thyrotoxicosis, unspecified without thyrotoxic crisis or storm: Secondary | ICD-10-CM | POA: Insufficient documentation

## 2017-01-05 ENCOUNTER — Encounter: Payer: Self-pay | Admitting: Internal Medicine

## 2017-01-05 ENCOUNTER — Ambulatory Visit (INDEPENDENT_AMBULATORY_CARE_PROVIDER_SITE_OTHER): Payer: No Typology Code available for payment source | Admitting: Internal Medicine

## 2017-01-05 VITALS — BP 124/84 | HR 102 | Ht 60.0 in | Wt 251.0 lb

## 2017-01-05 DIAGNOSIS — E0590 Thyrotoxicosis, unspecified without thyrotoxic crisis or storm: Secondary | ICD-10-CM

## 2017-01-05 DIAGNOSIS — E0500 Thyrotoxicosis with diffuse goiter without thyrotoxic crisis or storm: Secondary | ICD-10-CM | POA: Insufficient documentation

## 2017-01-05 NOTE — Progress Notes (Signed)
Patient ID: Carmen Henderson, female   DOB: 1970/03/28, 46 y.o.   MRN: [id number]    HPI  Carmen Henderson is a 46 y.o.-year-old female, referred by her PCP, Dr. 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  I reviewed pt's thyroid tests: 12/25/2016: TSH 0.188 (0.45-5.33), free T4 0.8 (0.6-1.4), free T3 4.51 (2.3-4.2), TPO Abs 834.2, ATA <1.8 12/04/2015: TSH 2.129 No results found for: TSH, FREET4   She denies - fatigue - excessive sweating/heat intolerance - tremors - anxiety - palpitations - hyperdefecation - weight loss - hair loss  Pt denies feeling nodules in neck, hoarseness, dysphagia/odynophagia, SOB with lying down.  Pt does have a FH of thyroid ds.: daughter and mother No FH of thyroid cancer. No h/o radiation tx to head or neck.  No seaweed or kelp, no recent contrast studies. No steroid use. No herbal supplements. + Biotin use - 1000 mcg daily (last dose last night).  Pt. also has a history of migraines.  ROS: Constitutional: + weight gain - 22 lbs in last 1 year (brother passing away), no fatigue, no subjective hyperthermia/hypothermia Eyes: no blurry vision, no xerophthalmia ENT: no sore throat, no nodules palpated in throat, no dysphagia/odynophagia, no hoarseness Cardiovascular: no CP/SOB/palpitations/leg swelling Respiratory: no cough/SOB Gastrointestinal: no N/V/D/C/+ heartburn Musculoskeletal: no muscle/joint aches Skin: no rashes Neurological: no tremors/numbness/tingling/dizziness, + migraine HAs Psychiatric: no depression/anxiety  Past Medical History:  Diagnosis Date  . Fibroids   . Headache   . Migraine    Past Surgical History:  Procedure Laterality Date  . CARPAL TUNNEL RELEASE Right   . CESAREAN SECTION     x2  . FOOT SURGERY Left   . IR GENERIC HISTORICAL  05/29/2014   IR RADIOLOGIST EVAL & MGMT 05/29/2014 Sandi Mariscal, MD [facility] INTERV RAD  . KNEE ARTHROPLASTY Right    Social History   Social History  . Marital  status: Single    Spouse name: N/A  . Number of children: 2   Occupational History  . Food and Lawyer   Social History Main Topics  . Smoking status: Never Smoker  . Smokeless tobacco: Never Used  . Alcohol use No  . Drug use: No   Current Outpatient Prescriptions on File Prior to Visit  Medication Sig Dispense Refill  . aspirin-acetaminophen-caffeine (EXCEDRIN MIGRAINE) 250-250-65 MG per tablet Take 2 tablets by mouth every 6 (six) hours as needed for headache.    . ferrous sulfate 325 (65 FE) MG EC tablet Take 325 mg by mouth daily.    . Multiple Vitamin (MULTIVITAMIN WITH MINERALS) TABS tablet Take 1 tablet by mouth daily.     No current facility-administered medications on file prior to visit.    No Known Allergies  FH: DM in sisters and mother HTN in mother and sister Heart ds in mother Br CA in mother, leukemia in father + see HPI  PE: BP 124/84 (BP Location: Left Arm, Patient Position: Sitting)   Pulse (!) 102   Ht 5' (1.524 m)   Wt 251 lb (113.9 kg)   LMP 12/31/2016   SpO2 97%   BMI 49.02 kg/m  Wt Readings from Last 3 Encounters:  01/05/17 251 lb (113.9 kg)  09/11/14 252 lb 6.4 oz (114.5 kg)  05/29/14 228 lb (103.4 kg)   Constitutional: obese, in NAD Eyes: PERRLA, EOMI, no exophthalmos, no lid lag, no stare ENT: moist mucous membranes, no thyromegaly, no thyroid bruits, no cervical lymphadenopathy Cardiovascular: tachycardia, RR, No MRG Respiratory: CTA  B Gastrointestinal: abdomen soft, NT, ND, BS+ Musculoskeletal: no deformities, strength intact in all 4 Skin: moist, warm, no rashes Neurological: no tremor with outstretched hands, DTR normal in all 4  ASSESSMENT: 1. Thyrotoxicosis  PLAN:  1. Patient with a recently found mildly low TSH x1 + high fT3, without thyrotoxic sxs: weight loss, heat intolerance, hyperdefecation, palpitations, anxiety, but she does have a rapid HR. Previous TSH from 1 year ago has been normal. - Of note, pt is on  Biotin, which can interfere with the thyroid hormone assay and is frequently an exogenous cause for low TSH and high fT4 and fT3. She is not on steroids, she did not have a recent contrasted CT scan and does not take iodine supplements. Based on the hx and the lack of sxs, the most likely cause for her abnormal TFTs is the Biotin dosing before the labs, however, we discussed that other possible causes of thyrotoxicosis are:  Graves ds   Thyroiditis toxic multinodular goiter/ toxic adenoma (I cannot feel nodules at palpation of her thyroid). - will check the TSH, fT3 and fT4 again today - If the tests remain abnormal, we may need an uptake and scan to differentiate between the 3 above possible etiologies  - possible modalities of treatment for the above conditions, to include methimazole use, radioactive iodine ablation or (last resort) surgery. - we may need to do thyroid ultrasound depending on the results of the uptake and scan (if a cold nodule is present) - I do not feel that we need to add beta blockers at this time, but she is tachycardic and may need to start if TFTs are abnormal - I advised her to join my chart to communicate easier - RTC if the results return abnormal  Component     Latest Ref Rng & Units 01/14/2017  TSH     0.35 - 4.50 uIU/mL 0.03 (L)  T4,Free(Direct)     0.60 - 1.60 ng/dL 0.92  Triiodothyronine,Free,Serum     2.3 - 4.2 pg/mL 4.6 (H)   TFTs abnormal >> likely Graves ds >> will start Methimazole 5 mg 2x daily and have her back in 4-5 weeks for labs. Will check TSI Abs then.  Will also start Atenolol 25 mg at night. Will advise her to RTC in 3 mo for an appt.  Philemon Kingdom, MD PhD [facility]

## 2017-01-05 NOTE — Patient Instructions (Signed)
Please stop Biotin for the next 5 days and then come back for repeat labs.  We will schedule a new appt if the results are abnormal.

## 2017-01-11 ENCOUNTER — Other Ambulatory Visit: Payer: No Typology Code available for payment source

## 2017-01-12 ENCOUNTER — Other Ambulatory Visit: Payer: No Typology Code available for payment source

## 2017-01-14 ENCOUNTER — Other Ambulatory Visit (INDEPENDENT_AMBULATORY_CARE_PROVIDER_SITE_OTHER): Payer: No Typology Code available for payment source

## 2017-01-14 DIAGNOSIS — E0590 Thyrotoxicosis, unspecified without thyrotoxic crisis or storm: Secondary | ICD-10-CM

## 2017-01-14 LAB — T3, FREE: T3, Free: 4.6 pg/mL — ABNORMAL HIGH (ref 2.3–4.2)

## 2017-01-14 LAB — T4, FREE: Free T4: 0.92 ng/dL (ref 0.60–1.60)

## 2017-01-14 LAB — TSH: TSH: 0.03 u[IU]/mL — AB (ref 0.35–4.50)

## 2017-01-14 MED ORDER — METHIMAZOLE 5 MG PO TABS: 5.0000 mg | ORAL_TABLET | Freq: Two times a day (BID) | ORAL | 2 refills | Status: DC

## 2017-01-14 MED ORDER — ATENOLOL 25 MG PO TABS: 25.0000 mg | ORAL_TABLET | Freq: Every day | ORAL | 2 refills | Status: DC

## 2017-02-17 ENCOUNTER — Other Ambulatory Visit (INDEPENDENT_AMBULATORY_CARE_PROVIDER_SITE_OTHER): Payer: No Typology Code available for payment source

## 2017-02-17 DIAGNOSIS — E0590 Thyrotoxicosis, unspecified without thyrotoxic crisis or storm: Secondary | ICD-10-CM | POA: Diagnosis not present

## 2017-02-17 LAB — T3, FREE: T3, Free: 3.8 pg/mL (ref 2.3–4.2)

## 2017-02-17 LAB — T4, FREE: Free T4: 0.68 ng/dL (ref 0.60–1.60)

## 2017-02-17 LAB — TSH: TSH: 0.1 u[IU]/mL — AB (ref 0.35–4.50)

## 2017-02-18 ENCOUNTER — Other Ambulatory Visit: Payer: No Typology Code available for payment source

## 2017-02-20 ENCOUNTER — Encounter: Payer: Self-pay | Admitting: Internal Medicine

## 2017-02-21 LAB — THYROID STIMULATING IMMUNOGLOBULIN: TSI: 446 %{baseline} — AB (ref <140)

## 2017-02-22 ENCOUNTER — Other Ambulatory Visit: Payer: Self-pay | Admitting: Internal Medicine

## 2017-02-22 DIAGNOSIS — E0590 Thyrotoxicosis, unspecified without thyrotoxic crisis or storm: Secondary | ICD-10-CM

## 2017-02-24 ENCOUNTER — Inpatient Hospital Stay
Admission: RE | Admit: 2017-02-24 | Discharge: 2017-02-24 | Disposition: A | Discharge status: Home / Self Care | Payer: No Typology Code available for payment source | Source: Ambulatory Visit | Attending: Obstetrics and Gynecology | Admitting: Obstetrics and Gynecology

## 2017-03-04 ENCOUNTER — Inpatient Hospital Stay: Admission: RE | Admit: 2017-03-04 | Payer: No Typology Code available for payment source | Source: Ambulatory Visit

## 2017-03-26 ENCOUNTER — Inpatient Hospital Stay: Admission: RE | Admit: 2017-03-26 | Payer: No Typology Code available for payment source | Source: Ambulatory Visit

## 2017-04-01 ENCOUNTER — Other Ambulatory Visit: Payer: Self-pay | Admitting: Obstetrics and Gynecology

## 2017-04-01 ENCOUNTER — Ambulatory Visit
Admission: RE | Admit: 2017-04-01 | Discharge: 2017-04-01 | Disposition: A | Discharge status: Home / Self Care | Payer: No Typology Code available for payment source | Source: Ambulatory Visit | Attending: Obstetrics and Gynecology | Admitting: Obstetrics and Gynecology

## 2017-04-01 ENCOUNTER — Other Ambulatory Visit: Payer: Self-pay | Admitting: Internal Medicine

## 2017-04-01 ENCOUNTER — Other Ambulatory Visit (INDEPENDENT_AMBULATORY_CARE_PROVIDER_SITE_OTHER): Payer: No Typology Code available for payment source

## 2017-04-01 DIAGNOSIS — N6320 Unspecified lump in the left breast, unspecified quadrant: Secondary | ICD-10-CM

## 2017-04-01 DIAGNOSIS — N63 Unspecified lump in unspecified breast: Secondary | ICD-10-CM

## 2017-04-01 DIAGNOSIS — E0590 Thyrotoxicosis, unspecified without thyrotoxic crisis or storm: Secondary | ICD-10-CM | POA: Diagnosis not present

## 2017-04-01 LAB — TSH: TSH: 4.18 u[IU]/mL (ref 0.35–4.50)

## 2017-04-01 LAB — T4, FREE: FREE T4: 0.55 ng/dL — AB (ref 0.60–1.60)

## 2017-04-01 LAB — T3, FREE: T3, Free: 3.4 pg/mL (ref 2.3–4.2)

## 2017-04-01 MED ORDER — METHIMAZOLE 5 MG PO TABS: 5.0000 mg | ORAL_TABLET | Freq: Every day | ORAL | 2 refills | Status: DC

## 2017-05-24 ENCOUNTER — Other Ambulatory Visit: Payer: Self-pay | Admitting: Internal Medicine

## 2017-05-24 DIAGNOSIS — E0590 Thyrotoxicosis, unspecified without thyrotoxic crisis or storm: Secondary | ICD-10-CM

## 2017-05-25 ENCOUNTER — Other Ambulatory Visit: Payer: No Typology Code available for payment source

## 2017-06-03 ENCOUNTER — Other Ambulatory Visit: Payer: Self-pay | Admitting: Internal Medicine

## 2017-06-14 ENCOUNTER — Other Ambulatory Visit (INDEPENDENT_AMBULATORY_CARE_PROVIDER_SITE_OTHER): Payer: No Typology Code available for payment source

## 2017-06-14 DIAGNOSIS — E0590 Thyrotoxicosis, unspecified without thyrotoxic crisis or storm: Secondary | ICD-10-CM | POA: Diagnosis not present

## 2017-06-14 LAB — TSH: TSH: 1.87 u[IU]/mL (ref 0.35–4.50)

## 2017-06-14 LAB — T3, FREE: T3 FREE: 3.4 pg/mL (ref 2.3–4.2)

## 2017-06-14 LAB — T4, FREE: FREE T4: 0.65 ng/dL (ref 0.60–1.60)

## 2017-10-01 ENCOUNTER — Other Ambulatory Visit: Payer: No Typology Code available for payment source

## 2017-10-21 DIAGNOSIS — Z9289 Personal history of other medical treatment: Secondary | ICD-10-CM

## 2017-10-21 HISTORY — DX: Personal history of other medical treatment: Z92.89

## 2017-10-22 ENCOUNTER — Ambulatory Visit
Admission: RE | Admit: 2017-10-22 | Discharge: 2017-10-22 | Disposition: A | Discharge status: Home / Self Care | Payer: No Typology Code available for payment source | Source: Ambulatory Visit | Attending: Obstetrics and Gynecology | Admitting: Obstetrics and Gynecology

## 2017-10-22 DIAGNOSIS — N6320 Unspecified lump in the left breast, unspecified quadrant: Secondary | ICD-10-CM

## 2017-11-03 ENCOUNTER — Other Ambulatory Visit: Payer: Self-pay

## 2017-11-03 ENCOUNTER — Emergency Department
Admission: EM | Admit: 2017-11-03 | Discharge: 2017-11-04 | Disposition: A | Discharge status: Home / Self Care | Payer: No Typology Code available for payment source | Attending: Emergency Medicine | Admitting: Emergency Medicine

## 2017-11-03 ENCOUNTER — Telehealth: Payer: Self-pay | Admitting: Internal Medicine

## 2017-11-03 ENCOUNTER — Telehealth: Payer: Self-pay

## 2017-11-03 ENCOUNTER — Encounter: Payer: Self-pay | Admitting: Internal Medicine

## 2017-11-03 ENCOUNTER — Encounter: Payer: Self-pay | Admitting: Emergency Medicine

## 2017-11-03 DIAGNOSIS — Z79899 Other long term (current) drug therapy: Secondary | ICD-10-CM | POA: Diagnosis not present

## 2017-11-03 DIAGNOSIS — N920 Excessive and frequent menstruation with regular cycle: Secondary | ICD-10-CM | POA: Diagnosis not present

## 2017-11-03 DIAGNOSIS — E0590 Thyrotoxicosis, unspecified without thyrotoxic crisis or storm: Secondary | ICD-10-CM

## 2017-11-03 DIAGNOSIS — D251 Intramural leiomyoma of uterus: Secondary | ICD-10-CM | POA: Diagnosis not present

## 2017-11-03 DIAGNOSIS — N939 Abnormal uterine and vaginal bleeding, unspecified: Secondary | ICD-10-CM | POA: Diagnosis present

## 2017-11-03 HISTORY — DX: Thyrotoxicosis with diffuse goiter without thyrotoxic crisis or storm: E05.00

## 2017-11-03 MED ORDER — SODIUM CHLORIDE 0.9 % IV BOLUS
1000.0000 mL | Freq: Once | INTRAVENOUS | Status: AC
  Administered 2017-11-03: 1000 mL via INTRAVENOUS

## 2017-11-03 NOTE — Telephone Encounter (Signed)
Canceled her lab visit because as I told her in her mychart message she can not come in until I speak to Dr. Cruzita Lederer who is not back in the office until 11/08/17. LVM for pt explaining that to her again

## 2017-11-03 NOTE — Telephone Encounter (Signed)
Patient stated Dr Cruzita Lederer sent her a message on my chart wanting her to schedule a lab appointment and follow up  There are no active request and patient will be coming in next Thursday for those labs.

## 2017-11-03 NOTE — Addendum Note (Signed)
Addended by: Renato Shin on: 11/03/2017 11:54 AM   Modules accepted: Orders

## 2017-11-03 NOTE — Telephone Encounter (Signed)
I ordered labs.  Please come in to have drawn.

## 2017-11-03 NOTE — Telephone Encounter (Signed)
Pt would like to get her labs done, no future orders placed. Was supposed to have had an appt 3-4 months after the 05/2017 visit. Please advise, she is aware you are gone until 11/08/17

## 2017-11-03 NOTE — ED Triage Notes (Signed)
Pt presents to ED with very heavy vaginal bleeding and  passing large clots. Pt states she started her period on Saturday and yesterday around 1400 she noticed a very large gush of blood. Pt states that has continued since then intermittently. Filling at least 2 - 3 pads an hour.  Pt states she was not able to work all day due to her bleeding. Denies pain. No hx of the same.

## 2017-11-04 ENCOUNTER — Emergency Department: Payer: No Typology Code available for payment source

## 2017-11-04 ENCOUNTER — Other Ambulatory Visit: Payer: Self-pay

## 2017-11-04 LAB — BASIC METABOLIC PANEL
ANION GAP: 7 (ref 5–15)
BUN: 16 mg/dL (ref 6–20)
CALCIUM: 8.1 mg/dL — AB (ref 8.9–10.3)
CO2: 24 mmol/L (ref 22–32)
Chloride: 108 mmol/L (ref 98–111)
Creatinine, Ser: 1.02 mg/dL — ABNORMAL HIGH (ref 0.44–1.00)
GFR calc Af Amer: >60 mL/min (ref >60)
GLUCOSE: 142 mg/dL — AB (ref 70–99)
Potassium: 3.3 mmol/L — ABNORMAL LOW (ref 3.5–5.1)
SODIUM: 139 mmol/L (ref 135–145)

## 2017-11-04 LAB — PROTIME-INR
INR: 0.93
PROTHROMBIN TIME: 12.4 s (ref 11.4–15.2)

## 2017-11-04 LAB — TYPE AND SCREEN
ABO/RH(D): O POS
Antibody Screen: NEGATIVE

## 2017-11-04 LAB — APTT: aPTT: 30 seconds (ref 24–36)

## 2017-11-04 LAB — CBC
HCT: 34.6 % — ABNORMAL LOW (ref 35.0–47.0)
Hemoglobin: 11.9 g/dL — ABNORMAL LOW (ref 12.0–16.0)
MCH: 32.5 pg (ref 26.0–34.0)
MCHC: 34.4 g/dL (ref 32.0–36.0)
MCV: 94.5 fL (ref 80.0–100.0)
PLATELETS: 190 10*3/uL (ref 150–440)
RBC: 3.66 MIL/uL — AB (ref 3.80–5.20)
RDW: 14.2 % (ref 11.5–14.5)
WBC: 4.8 10*3/uL (ref 3.6–11.0)

## 2017-11-04 LAB — HCG, QUANTITATIVE, PREGNANCY: hCG, Beta Chain, Quant, S: <1 m[IU]/mL (ref <5)

## 2017-11-04 NOTE — ED Notes (Signed)
Pt is at ultrasound

## 2017-11-04 NOTE — ED Notes (Signed)
Pt is back from ultrasound.

## 2017-11-04 NOTE — ED Provider Notes (Signed)
[facility] Emergency Department Provider Note  Time seen: 11:40 PM I have reviewed the triage vital signs and the nursing notes.   HISTORY  Chief Complaint Vaginal Bleeding    HPI Carmen Henderson is a 47 y.o. female with below list of chronic medical conditions occluding previous uterine fibroid requiring embolization presents to the emergency department with heavy vaginal bleeding which began on Saturday.  Patient states that for the past year her disease has been irregular with each menstrual cycle only lasting one day.  Patient states that she was informed by her OB/GYN that this was secondary to premenopausal state.  Patient denies any abdominal pelvic pain.  Patient denies any lightheadedness or dizziness.  Patient states that she is using approximately 2-3 pads per hour   Past Medical History:  Diagnosis Date  . Fibroids   . Graves disease   . Headache   . Migraine     Patient Active Problem List   Diagnosis Date Noted  . Thyrotoxicosis without thyroid storm 01/05/2017  . Fibroid, uterine     Past Surgical History:  Procedure Laterality Date  . CARPAL TUNNEL RELEASE Right   . CESAREAN SECTION     x2  . FOOT SURGERY Left   . IR GENERIC HISTORICAL  05/29/2014   IR RADIOLOGIST EVAL & MGMT 05/29/2014 Sandi Mariscal, MD [facility] INTERV RAD  . KNEE ARTHROPLASTY Right     Prior to Admission medications   Medication Sig Start Date End Date Taking? Authorizing Provider  aspirin-acetaminophen-caffeine (EXCEDRIN MIGRAINE) [phone] MG per tablet Take 2 tablets by mouth every 6 (six) hours as needed for headache.    [provider]  atenolol (TENORMIN) 25 MG tablet Take 1 tablet (25 mg total) by mouth daily. 01/14/17   Philemon Kingdom, MD  Biotin 1 MG CAPS Take by mouth.    [provider]  ferrous sulfate 325 (65 FE) MG EC tablet Take 325 mg by mouth daily.    [provider]  methimazole (TAPAZOLE) 5 MG tablet Take 1 tablet (5  mg total) by mouth daily. With meals. 04/01/17   Philemon Kingdom, MD  methimazole (TAPAZOLE) 5 MG tablet TAKE 1 TABLET BY MOUTH 2 TIMES DAILY. WITH MEALS. 06/03/17   Philemon Kingdom, MD  Multiple Vitamin (MULTIVITAMIN WITH MINERALS) TABS tablet Take 1 tablet by mouth daily.    [provider]  nortriptyline (PAMELOR) 10 MG capsule Take 10 mg by mouth. 12/06/15   [provider]  TROKENDI XR 50 MG CP24  12/23/16   [provider]  zolmitriptan (ZOMIG-ZMT) 2.5 MG disintegrating tablet Take 2.5 mg by mouth. 06/04/16   [provider]    Allergies No known drug allergies No family history on file.  Social History Social History   Tobacco Use  . Smoking status: Never Smoker  . Smokeless tobacco: Never Used  Substance Use Topics  . Alcohol use: No    Alcohol/week: 0.0 standard drinks  . Drug use: No    Review of Systems Constitutional: No fever/chills Eyes: No visual changes. ENT: No sore throat. Cardiovascular: Denies chest pain. Respiratory: Denies shortness of breath. Gastrointestinal: No abdominal pain.  No nausea, no vomiting.  No diarrhea.  No constipation. Genitourinary: Negative for dysuria.  Positive for heavy vaginal bleeding Musculoskeletal: Negative for neck pain.  Negative for back pain. Integumentary: Negative for rash. Neurological: Negative for headaches, focal weakness or numbness.   ____________________________________________   PHYSICAL EXAM:  VITAL SIGNS: ED Triage Vitals  Enc Vitals Group     BP 11/03/17 2312 131/82     Pulse Rate 11/03/17 2312 (!) 125     Resp 11/03/17 2312 20     Temp 11/03/17 2312 (!) 97.5 F (36.4 C)     Temp Source 11/03/17 2312 Oral     SpO2 11/03/17 2312 97 %     Weight 11/03/17 2316 112.5 kg (248 lb)     Height 11/03/17 2316 1.499 m (4\' 11" )     Head Circumference --      Peak Flow --      Pain Score 11/03/17 2312 0     Pain Loc --      Pain Edu? --      Excl. in [place]? --      Constitutional: Alert and oriented. Well appearing and in no acute distress. Eyes: Conjunctivae are normal.  Head: Atraumatic. Mouth/Throat: Mucous membranes are moist.  Oropharynx non-erythematous. Neck: No stridor.   Cardiovascular: Normal rate, regular rhythm. Good peripheral circulation. Grossly normal heart sounds. Respiratory: Normal respiratory effort.  No retractions. Lungs CTAB. Gastrointestinal: Soft and nontender. No distention.  Genitourinary: Mild vaginal bleeding. Musculoskeletal: No lower extremity tenderness nor edema. No gross deformities of extremities. Neurologic:  Normal speech and language. No gross focal neurologic deficits are appreciated.  Skin:  Skin is warm, dry and intact. No rash noted. Psychiatric: Mood and affect are normal. Speech and behavior are normal.**}  ____________________________________________   LABS (all labs ordered are listed, but only abnormal results are displayed)  Labs Reviewed  CBC - Abnormal; Notable for the following components:      Result Value   RBC 3.66 (*)    Hemoglobin 11.9 (*)    HCT 34.6 (*)    All other components within normal limits  BASIC METABOLIC PANEL - Abnormal; Notable for the following components:   Potassium 3.3 (*)    Glucose, Bld 142 (*)    Creatinine, Ser 1.02 (*)    Calcium 8.1 (*)    All other components within normal limits  PROTIME-INR  APTT  HCG, QUANTITATIVE, PREGNANCY  TYPE AND SCREEN   _  RADIOLOGY I, Ozark N Kamaree Berkel, personally viewed and evaluated these images (plain radiographs) as part of my medical decision making, as well as reviewing the written report by the radiologist.  ED MD interpretation: Large uterus with multiple intramural leiomyoma  Official radiology report(s): US Pelvic Complete With Transvaginal  Result Date: 11/04/2017 CLINICAL DATA:  Heavy vaginal bleeding since yesterday. Last menstrual period October 30, 2017 EXAM: TRANSABDOMINAL AND TRANSVAGINAL ULTRASOUND OF  PELVIS DOPPLER ULTRASOUND OF OVARIES TECHNIQUE: Both transabdominal and transvaginal ultrasound examinations of the pelvis were performed. Transabdominal technique was performed for global imaging of the pelvis including uterus, ovaries, adnexal regions, and pelvic cul-de-sac. It was necessary to proceed with endovaginal exam following the transabdominal exam to visualize the endometrium and adnexa. Color and duplex Doppler ultrasound was utilized to evaluate blood flow to the ovaries. COMPARISON:  None. FINDINGS: Limited endovaginal imaging due to enlarged uterus obscuring anatomy. Uterus Measurements: 18.2 x 7.3 x 10.2 cm. Multiple uterine masses, trauma isoechoic though some de demonstrate echogenic margin most compatible with calcification. Masses are predominately intramural, largest within RIGHT uterine wall measuring 6 x 5.4 x 5.7 cm. Endometrium Thickness: 14 mm.  Echogenic debris within endometrial cavity. Right ovary Not sonographically identified. Left ovary Not sonographically identified. Pulsed Doppler not performed as adnexa not identified. Other findings No abnormal free fluid. IMPRESSION: 1. Limited endovaginal imaging.  Ovaries not sonographically identified. 2. Enlarged uterus at 18.2 cm with multiple intramural leiomyomas. 3. Echogenic debris within endometrial cavity. If bleeding remains unresponsive to hormonal or medical therapy, sonohysterogram should be considered for focal lesion work-up. (Ref: Radiological Reasoning: Algorithmic Workup of Abnormal Vaginal Bleeding with Endovaginal Sonography and Sonohysterography. AJR 2008; 286:N81-77) Electronically Signed   By: Elon Alas M.D.   On: 11/04/2017 02:13    ____________________________________________   Procedures   ____________________________________________   INITIAL IMPRESSION / ASSESSMENT AND PLAN / ED COURSE  As part of my medical decision making, I reviewed the following data within the electronic MEDICAL RECORD NUMBER    47 year old female presented with above-stated history and physical exam secondary to menorrhagia.  Concern for possible uterine fibroid as the etiology for the patient's irregular menses and as such ultrasound was performed which revealed multiple intramural leiomyoma.  Physical exam patient had mild vaginal bleeding.  Laboratory data revealed a hemoglobin of 11.9 hematocrit 34.6.  Patient hemodynamically stable.  Patient discussed with Verdene Rio NP with [facility].  Patient will follow up with her gynecologist today.  Patient informed of all clinical findings. ____________________________________________  FINAL CLINICAL IMPRESSION(S) / ED DIAGNOSES  Final diagnoses:  Menorrhagia  Fibroids, intramural     MEDICATIONS GIVEN DURING THIS VISIT:  Medications  sodium chloride 0.9 % bolus 1,000 mL (0 mLs Intravenous Stopped 11/04/17 0209)     ED Discharge Orders    None       Note:  This document was prepared using Dragon voice recognition software and may include unintentional dictation errors.    Gregor Hams, MD 11/04/17 [phone]

## 2017-11-04 NOTE — ED Notes (Signed)
Pt stated that she her menstrual cycle on Saturday and it typical last for one year. Then on Sunday she had some spotting then on Monday it got a little worse. Then yesterday it become more heavy and she went and saw her GYN doctor and she informed her to take 800 mg of motrin every 6 hrs for the next 48 hrs. Pt stated that today while she was at work the bleeding extremely heavy and she was having to change her pad every 20 minutes. Pt denies any pain at this time.

## 2017-11-05 ENCOUNTER — Encounter (HOSPITAL_COMMUNITY): Payer: Self-pay

## 2017-11-05 ENCOUNTER — Other Ambulatory Visit: Payer: Self-pay

## 2017-11-05 ENCOUNTER — Observation Stay (HOSPITAL_COMMUNITY)
Admission: AD | Admit: 2017-11-05 | Discharge: 2017-11-06 | Disposition: A | Discharge status: Home / Self Care | Payer: PRIVATE HEALTH INSURANCE | Source: Ambulatory Visit | Attending: Obstetrics | Admitting: Obstetrics

## 2017-11-05 DIAGNOSIS — D649 Anemia, unspecified: Secondary | ICD-10-CM | POA: Diagnosis not present

## 2017-11-05 DIAGNOSIS — N939 Abnormal uterine and vaginal bleeding, unspecified: Secondary | ICD-10-CM | POA: Diagnosis present

## 2017-11-05 DIAGNOSIS — N938 Other specified abnormal uterine and vaginal bleeding: Principal | ICD-10-CM | POA: Insufficient documentation

## 2017-11-05 LAB — CBC
HCT: 27 % — ABNORMAL LOW (ref 36.0–46.0)
HCT: 22.9 % — ABNORMAL LOW (ref 36.0–46.0)
HEMOGLOBIN: 9.4 g/dL — AB (ref 12.0–15.0)
Hemoglobin: 7.9 g/dL — ABNORMAL LOW (ref 12.0–15.0)
MCH: 32.5 pg (ref 26.0–34.0)
MCH: 32.9 pg (ref 26.0–34.0)
MCHC: 34.8 g/dL (ref 30.0–36.0)
MCHC: 34.5 g/dL (ref 30.0–36.0)
MCV: 93.4 fL (ref 78.0–100.0)
MCV: 95.4 fL (ref 78.0–100.0)
PLATELETS: 241 10*3/uL (ref 150–400)
PLATELETS: 175 10*3/uL (ref 150–400)
RBC: 2.89 MIL/uL — AB (ref 3.87–5.11)
RBC: 2.4 MIL/uL — ABNORMAL LOW (ref 3.87–5.11)
RDW: 14 % (ref 11.5–15.5)
RDW: 14 % (ref 11.5–15.5)
WBC: 6.5 10*3/uL (ref 4.0–10.5)
WBC: 6.3 10*3/uL (ref 4.0–10.5)

## 2017-11-05 LAB — COMPREHENSIVE METABOLIC PANEL
ALT: 12 U/L (ref 0–44)
AST: 12 U/L — AB (ref 15–41)
Albumin: 2.9 g/dL — ABNORMAL LOW (ref 3.5–5.0)
Alkaline Phosphatase: 70 U/L (ref 38–126)
Anion gap: 8 (ref 5–15)
BUN: 16 mg/dL (ref 6–20)
CHLORIDE: 111 mmol/L (ref 98–111)
CO2: 19 mmol/L — AB (ref 22–32)
CREATININE: 0.82 mg/dL (ref 0.44–1.00)
Calcium: 8.3 mg/dL — ABNORMAL LOW (ref 8.9–10.3)
GFR calc Af Amer: >60 mL/min (ref >60)
GFR calc non Af Amer: >60 mL/min (ref >60)
GLUCOSE: 151 mg/dL — AB (ref 70–99)
Potassium: 3.6 mmol/L (ref 3.5–5.1)
SODIUM: 138 mmol/L (ref 135–145)
Total Bilirubin: 0.6 mg/dL (ref 0.3–1.2)
Total Protein: 5.6 g/dL — ABNORMAL LOW (ref 6.5–8.1)

## 2017-11-05 LAB — ABO/RH: ABO/RH(D): O POS

## 2017-11-05 LAB — POCT PREGNANCY, URINE: PREG TEST UR: NEGATIVE

## 2017-11-05 LAB — PREPARE RBC (CROSSMATCH)

## 2017-11-05 LAB — TSH: TSH: 1.755 u[IU]/mL (ref 0.350–4.500)

## 2017-11-05 MED ORDER — ONDANSETRON HCL 4 MG/2ML IJ SOLN: 4.0000 mg | Freq: Four times a day (QID) | INTRAMUSCULAR | Status: DC | PRN

## 2017-11-05 MED ORDER — ACETAMINOPHEN 325 MG PO TABS
650.0000 mg | ORAL_TABLET | Freq: Once | ORAL | Status: AC
  Administered 2017-11-05: 650 mg via ORAL
  Filled 2017-11-05: qty 2

## 2017-11-05 MED ORDER — MEGESTROL ACETATE 40 MG PO TABS
80.0000 mg | ORAL_TABLET | Freq: Once | ORAL | Status: AC
  Administered 2017-11-05: 80 mg via ORAL
  Filled 2017-11-05: qty 2

## 2017-11-05 MED ORDER — GUAIFENESIN 100 MG/5ML PO SOLN
15.0000 mL | ORAL | Status: DC | PRN
  Filled 2017-11-05: qty 15

## 2017-11-05 MED ORDER — SODIUM CHLORIDE 0.9 % IV SOLN
Freq: Once | INTRAVENOUS | Status: AC
  Administered 2017-11-05: 18:00:00 via INTRAVENOUS

## 2017-11-05 MED ORDER — LACTATED RINGERS IV SOLN
INTRAVENOUS | Status: DC
  Administered 2017-11-06: 05:00:00 via INTRAVENOUS

## 2017-11-05 MED ORDER — DIPHENHYDRAMINE HCL 25 MG PO CAPS
25.0000 mg | ORAL_CAPSULE | Freq: Once | ORAL | Status: AC
  Administered 2017-11-05: 25 mg via ORAL
  Filled 2017-11-05: qty 1

## 2017-11-05 MED ORDER — MENTHOL 3 MG MT LOZG: 1.0000 | LOZENGE | OROMUCOSAL | Status: DC | PRN

## 2017-11-05 MED ORDER — SODIUM CHLORIDE 0.9 % IV SOLN
Freq: Once | INTRAVENOUS | Status: AC
  Administered 2017-11-05: 21:00:00 via INTRAVENOUS

## 2017-11-05 MED ORDER — ONDANSETRON HCL 4 MG PO TABS: 4.0000 mg | ORAL_TABLET | Freq: Four times a day (QID) | ORAL | Status: DC | PRN

## 2017-11-05 MED ORDER — SODIUM CHLORIDE 0.9% IV SOLUTION
Freq: Once | INTRAVENOUS | Status: AC
  Administered 2017-11-05: via INTRAVENOUS

## 2017-11-05 NOTE — H&P (Addendum)
Chief complaint: Heavy bleeding, dizziness  History of present illness: 47 year old G2 P2, nonpregnant with known history of fibroids and prior history of menorrhagia, previously controlled with uterine artery embolization in 2016 presents with heavy vaginal bleeding since Tuesday. Carmen Henderson presented to the ER on Wednesday with a hemoglobin of 11. She was told to follow-up as an outpatient. Carmen Henderson was seen in the office yesterday,  despite Carmen Henderson's bleeding her overall clinical picture was relatively stable. She was started on Megace with plan for follow-up again next week. Carmen Henderson had some slowing of bleeding with her initial doses of Megace yesterday but today she started bleeding heavily. In the office she was diaphoretic, lightheaded and had tachycardia and heavy vaginal bleeding. An ultrasound was done which showed uterus 18.2 x 7.3 x 10.2 with a 14 mm endometrial stripe. Hemoglobin turned at 9.9. Given acute drop in her hemoglobin, heavy bleeding and symptomatic anemia Carmen Henderson was sent MAU for further evaluation.  Upon arrival in MAU Carmen Henderson's bleeding began to slow but she remained diaphoretic and tachycardic. She had a normal EKG. Repeat hemoglobin was consistent and stable from that in the office. Carmen Henderson had speculum exam by nurse practitioner in MAU which showed minimal bleeding from the cervix. Carmen Henderson is currently on her second unit of IV fluids but was unable to ambulate without dizziness and desaturation.  Currently Carmen Henderson notes no headache, no shortness of breath while lying in bed. Carmen Henderson notes no active bleeding. Carmen Henderson denies cough, fever recent illness. Carmen Henderson reports no abdominal pain or cramping.  Past medical history: Obesity, hyperthyroidism, hypertension  Family history: Mom with breast cancer  Medications: Methimazole, Megace 40 mg twice a day  Past surgical history: Cesarean section 2  PE:   Vitals:   11/05/17 1955 11/05/17 2003 11/05/17 2020 11/05/17 2118  BP:  116/87 112/87 111/88 126/77  Pulse: (!) 126 (!) 132 (!) 138 (!) 117  Resp:      SpO2:    93%  Weight:      Height:       General: Obese woman lying in bed, no acute distress but breathing appears labored Cardiovascular: Regular rhythm, tachycardic, no murmur rub or gallop Pulmonary: Clear to auscultation bilaterally Abdomen: Obese, no right upper quadrant pain, no distention, no tympany, no tenderness GU: Deferred as just done by nurse practitioner Lower extremity: Trace edema, nontender  CBC Latest Ref Rng & Units 11/05/2017 11/03/2017 09/10/2014  WBC 4.0 - 10.5 K/uL 6.5 4.8 3.7(L)  Hemoglobin 12.0 - 15.0 g/dL 9.4(L) 11.9(L) 11.6(L)  Hematocrit 36.0 - 46.0 % 27.0(L) 34.6(L) 36.7  Platelets 150 - 400 K/uL 241 190 203    Assessment and plan:47 year old G2 P2, reportedly nonpregnant Carmen Henderson presenting with acute vaginal bleeding and thickened endometrial stripe.  - Vaginal bleeding. Will check UPT to rule out pregnancy, likely due to fibroids.Thickened endometrial stripe needs workup for hyperplasia given Carmen Henderson's obesity and if negative recommend proceeding with sonohysterogram. Given the acute bleeding Carmen Henderson is now considering hysterectomy but this would be best done as an outpatient with appropriate planning. Would recommend Carmen Henderson to get thyroid management under control, complete exam with primary care doctor and a sleep study to assess for sleep apnea given her observed breathing tonight. Will increase her Megace to 80 mg twice a day. Can also give when necessary oral transexamic acid. Carmen Henderson denies history of VTE. - hyperthyroidism. May be impacting bleeding and tachycardia. Will repeat TSH with T3, T4 - Symptomatic anemia. Carmen Henderson's tachycardia is more than would be expected with hemoglobin of  9.4. Will add ferritin level and repeat hemoglobin now. If Carmen Henderson remains symptomatic or lower hemoglobin would consider blood transfusion. Would also consider iron transfusion with low  ferritin.   Carmen Henderson 11/05/2017 10:06 PM      Pt again with dizziness, tachycardia and desaturation with ambulation.  CBC Latest Ref Rng & Units 11/05/2017 11/05/2017 11/03/2017  WBC 4.0 - 10.5 K/uL 6.3 6.5 4.8  Hemoglobin 12.0 - 15.0 g/dL 7.9(L) 9.4(L) 11.9(L)  Hematocrit 36.0 - 46.0 % 22.9(L) 27.0(L) 34.6(L)  Platelets 150 - 400 K/uL 175 241 190   A/P: symptomatic anemia, admit for observation and 2units pRBC. CBC in am.  Carmen Henderson 11/05/2017 10:56 PM

## 2017-11-05 NOTE — MAU Provider Note (Addendum)
History     CSN: [id number]  Arrival date and time: 11/05/17 1726   First Provider Initiated Contact with Patient 11/05/17 1758      Chief Complaint  Patient presents with  . Vaginal Bleeding   HPI   Ms.Carmen Henderson is a 47 y.o. female G2P2002 here in MAU with complaints of heavy vaginal bleeding. She has a history of HA's and fibroids. The bleeding started on Saturday. The bleeding is different from her normal menstrual cycle. The bleeding is heavy with clots; changed 2-3 pads per hour. She is dizzy and feels like she is going to pass. She was prescribed Megace yesterday and has told to take 40 mg BID. She has taken a total of 4 doses.  She was seen In the office today and had another Hgb drawn which was 9.9.   Patient diaphoretic and dizzy upon arrival.   OB History    Gravida  2   Para  2   Term  2   Preterm      AB      Living  2     SAB      TAB      Ectopic      Multiple      Live Births  2           Past Medical History:  Diagnosis Date  . Fibroids   . Graves disease   . Headache   . Migraine     Past Surgical History:  Procedure Laterality Date  . CARPAL TUNNEL RELEASE Right   . CESAREAN SECTION     x2  . FOOT SURGERY Left   . IR GENERIC HISTORICAL  05/29/2014   IR RADIOLOGIST EVAL & MGMT 05/29/2014 Sandi Mariscal, MD [facility] INTERV RAD  . KNEE ARTHROPLASTY Right     No family history on file.  Social History   Tobacco Use  . Smoking status: Never Smoker  . Smokeless tobacco: Never Used  Substance Use Topics  . Alcohol use: No    Alcohol/week: 0.0 standard drinks  . Drug use: No    Allergies: No Known Allergies  Medications Prior to Admission  Medication Sig Dispense Refill Last Dose  . aspirin-acetaminophen-caffeine (EXCEDRIN MIGRAINE) 250-250-65 MG per tablet Take 2 tablets by mouth every 6 (six) hours as needed for headache.   Taking  . atenolol (TENORMIN) 25 MG tablet Take 1 tablet (25 mg total) by mouth Carmen. 45 tablet 2    . Biotin 1 MG CAPS Take by mouth.   Taking  . ferrous sulfate 325 (65 FE) MG EC tablet Take 325 mg by mouth Carmen.   Taking  . methimazole (TAPAZOLE) 5 MG tablet Take 1 tablet (5 mg total) by mouth Carmen. With meals. 60 tablet 2   . methimazole (TAPAZOLE) 5 MG tablet TAKE 1 TABLET BY MOUTH 2 TIMES Carmen. WITH MEALS. 90 tablet 2   . Multiple Vitamin (MULTIVITAMIN WITH MINERALS) TABS tablet Take 1 tablet by mouth Carmen.   Taking  . nortriptyline (PAMELOR) 10 MG capsule Take 10 mg by mouth.   Taking  . TROKENDI XR 50 MG CP24    Taking  . zolmitriptan (ZOMIG-ZMT) 2.5 MG disintegrating tablet Take 2.5 mg by mouth.   Taking   Results for orders placed or performed during the hospital encounter of 11/05/17 (from the past 48 hour(s))  CBC     Status: Abnormal   Collection Time: 11/05/17  6:07 PM  Result Value Ref Range  WBC 6.5 4.0 - 10.5 K/uL   RBC 2.89 (L) 3.87 - 5.11 MIL/uL   Hemoglobin 9.4 (L) 12.0 - 15.0 g/dL   HCT 27.0 (L) 36.0 - 46.0 %   MCV 93.4 78.0 - 100.0 fL   MCH 32.5 26.0 - 34.0 pg   MCHC 34.8 30.0 - 36.0 g/dL   RDW 14.0 11.5 - 15.5 %   Platelets 241 150 - 400 K/uL    Comment: Performed at [facility], [address], [place], [place] [address]  Type and screen [place]     Status: None   Collection Time: 11/05/17  6:07 PM  Result Value Ref Range   ABO/RH(D) O POS    Antibody Screen NEG    Sample Expiration      11/08/2017 Performed at [facility], [address] [place], [place] [address]    Review of Systems  Constitutional: Negative for fever.  Gastrointestinal: Negative for abdominal pain.  Genitourinary: Positive for vaginal bleeding.  Neurological: Positive for dizziness and weakness.   Physical Exam   Blood pressure 111/74, pulse (!) 121, resp. rate 20, height 4\' 11"  (1.499 m), weight 117.3 kg, last menstrual period 10/30/2017, SpO2 100 %.   Patient Vitals for the past 24 hrs:  BP Pulse Resp SpO2 Height Weight  11/05/17 1945  111/74 (!) 121 - 100 % - -  11/05/17 1921 - (!) 114 - - - -  11/05/17 1920 115/78 (!) 121 - 100 % - -  11/05/17 1917 105/62 (!) 117 - 100 % - -  11/05/17 1811 102/71 (!) 128 - - - -  11/05/17 1757 (!) 84/66 (!) 120 - - - -  11/05/17 1752 (!) 102/58 (!) 144 - - - -  11/05/17 1743 (!) 68/55 (!) 114 20 100 % 4\' 11"  (1.499 m) 117.3 kg    Physical Exam  Constitutional: She is oriented to person, place, and time. She appears well-developed and well-nourished. No distress.  HENT:  Head: Normocephalic.  Eyes: Pupils are equal, round, and reactive to light.  Genitourinary:  Genitourinary Comments: Vagina - Small/ scant amount blood noted. Quarter size dark clot noted at cervix.  Cervix - No contact bleeding, no active bleeding  Bimanual exam: Cervix closed Uterus non tender, enlarged  Adnexa non tender, no masses bilaterally Chaperone present for exam.   Musculoskeletal: Normal range of motion.  Neurological: She is alert and oriented to person, place, and time.  Skin: Skin is warm. She is not diaphoretic.  Psychiatric: Her behavior is normal.   MAU Course  Procedures  None  MDM  Tachycardia> Quant ordered CBC, CMP Discussed EKG with Dr. Marlou Porch with Cardiology; Sinus tachycardia with no acute findings requiring further assessment from cardiology today.  Patient up walking in the unit: elevated HR to 140's. Patient became diaphoretic and was assisted back to bed NS bolus X 1 Megace 80 mg PO given Discussed patient and labs with Dr. Valentino Saxon who is coming to MAU to see the patient  Repeat CBC at 11 pm  Carmen Henderson, Artist Pais, NP 11/05/2017 9:04 PM   Assessment and Plan

## 2017-11-05 NOTE — MAU Note (Signed)
Pt presents with c/o heavy VB that began Tuesday.  Currently taking Megace, had taken 4 doses.  Reports VB remains heavy and passing large clots Report dizziness and light headedness

## 2017-11-05 NOTE — MAU Note (Signed)
Urine in lab 

## 2017-11-06 ENCOUNTER — Other Ambulatory Visit: Payer: Self-pay

## 2017-11-06 ENCOUNTER — Encounter (HOSPITAL_COMMUNITY): Payer: Self-pay | Admitting: *Deleted

## 2017-11-06 DIAGNOSIS — N939 Abnormal uterine and vaginal bleeding, unspecified: Secondary | ICD-10-CM | POA: Diagnosis present

## 2017-11-06 LAB — CBC
HCT: 28.8 % — ABNORMAL LOW (ref 36.0–46.0)
HEMOGLOBIN: 9.7 g/dL — AB (ref 12.0–15.0)
MCH: 31 pg (ref 26.0–34.0)
MCHC: 33.7 g/dL (ref 30.0–36.0)
MCV: 92 fL (ref 78.0–100.0)
Platelets: 157 10*3/uL (ref 150–400)
RBC: 3.13 MIL/uL — ABNORMAL LOW (ref 3.87–5.11)
RDW: 14.7 % (ref 11.5–15.5)
WBC: 7 10*3/uL (ref 4.0–10.5)

## 2017-11-06 LAB — FERRITIN: FERRITIN: 24 ng/mL (ref 11–307)

## 2017-11-06 MED ORDER — TRANEXAMIC ACID 650 MG PO TABS: 1300.0000 mg | ORAL_TABLET | Freq: Three times a day (TID) | ORAL | 0 refills | Status: AC

## 2017-11-06 MED ORDER — SODIUM CHLORIDE 0.9 % IV SOLN
INTRAVENOUS | Status: DC | PRN
  Administered 2017-11-06: 02:00:00 via INTRAVENOUS

## 2017-11-07 LAB — BPAM RBC
BLOOD PRODUCT EXPIRATION DATE: 201909082359
BLOOD PRODUCT EXPIRATION DATE: 201909232359
Blood Product Expiration Date: 201909072359
ISSUE DATE / TIME: 201908162340
ISSUE DATE / TIME: 201908170217
UNIT TYPE AND RH: 5100
Unit Type and Rh: 5100
Unit Type and Rh: 5100

## 2017-11-07 LAB — TYPE AND SCREEN
ABO/RH(D): O POS
ANTIBODY SCREEN: NEGATIVE
UNIT DIVISION: 0
UNIT DIVISION: 0
Unit division: 0

## 2017-11-07 LAB — T3: T3, Total: 87 ng/dL (ref 71–180)

## 2017-11-07 LAB — T4: T4, Total: 4.8 ug/dL (ref 4.5–12.0)

## 2017-11-09 NOTE — Discharge Summary (Signed)
Carmen Henderson MRN: [id number] DOB/AGE: Feb 09, 1971 47 y.o.  Admit date: 11/05/2017 Discharge date: 11/06/17  Admission Diagnoses: HEAVY BLEEDING  Discharge Diagnoses: HEAVY BLEEDING        Active Problems:   Anemia   Vaginal bleeding   Discharged Condition: stable  Hospital Course: Admitted with tachycardia and pre-syncope with exertion. Hgb in 7's. Bleeding stopped by admission. Pt given Megace 80mg  bid. Received 2 units pRBC, in am Hgb up to 9. Able to ambulate w/o compliants and pt d/c home.   Consults: None  Treatments: IV hydration and blood transfusion  Disposition:    Allergies as of 11/06/2017   No Known Allergies     Medication List    TAKE these medications   Biotin 1 MG Caps Take by mouth.   EXCEDRIN MIGRAINE 250-250-65 MG tablet Generic drug:  aspirin-acetaminophen-caffeine Take 2 tablets by mouth every 6 (six) hours as needed for headache.   ferrous sulfate 325 (65 FE) MG EC tablet Take 325 mg by mouth daily.   methimazole 5 MG tablet Commonly known as:  TAPAZOLE Take 1 tablet (5 mg total) by mouth daily. With meals.   methimazole 5 MG tablet Commonly known as:  TAPAZOLE TAKE 1 TABLET BY MOUTH 2 TIMES DAILY. WITH MEALS.   multivitamin with minerals Tabs tablet Take 1 tablet by mouth daily.   nortriptyline 10 MG capsule Commonly known as:  PAMELOR Take 10 mg by mouth.   tranexamic acid 650 MG Tabs tablet Commonly known as:  LYSTEDA Take 2 tablets (1,300 mg total) by mouth 3 (three) times daily for 5 days. As needed for heavy bleeding   TROKENDI XR 50 MG Cp24 Generic drug:  Topiramate ER   zolmitriptan 2.5 MG disintegrating tablet Commonly known as:  ZOMIG-ZMT Take 2.5 mg by mouth.        Signed: [name], MD 11/09/2017, 9:52 PM

## 2017-11-11 ENCOUNTER — Other Ambulatory Visit: Payer: No Typology Code available for payment source

## 2017-11-11 ENCOUNTER — Other Ambulatory Visit (INDEPENDENT_AMBULATORY_CARE_PROVIDER_SITE_OTHER): Payer: No Typology Code available for payment source

## 2017-11-11 DIAGNOSIS — E0590 Thyrotoxicosis, unspecified without thyrotoxic crisis or storm: Secondary | ICD-10-CM | POA: Diagnosis not present

## 2017-11-11 LAB — TSH: TSH: 4.33 u[IU]/mL (ref 0.35–4.50)

## 2017-11-11 LAB — T4, FREE: Free T4: 0.68 ng/dL (ref 0.60–1.60)

## 2017-12-20 ENCOUNTER — Encounter: Payer: Self-pay | Admitting: Internal Medicine

## 2017-12-20 ENCOUNTER — Other Ambulatory Visit: Payer: Self-pay

## 2017-12-20 ENCOUNTER — Other Ambulatory Visit: Payer: Self-pay | Admitting: Internal Medicine

## 2017-12-20 ENCOUNTER — Ambulatory Visit (INDEPENDENT_AMBULATORY_CARE_PROVIDER_SITE_OTHER): Payer: No Typology Code available for payment source | Admitting: Internal Medicine

## 2017-12-20 VITALS — BP 126/74 | HR 110 | Ht 60.0 in | Wt 261.0 lb

## 2017-12-20 DIAGNOSIS — E0500 Thyrotoxicosis with diffuse goiter without thyrotoxic crisis or storm: Secondary | ICD-10-CM

## 2017-12-20 DIAGNOSIS — Z23 Encounter for immunization: Secondary | ICD-10-CM

## 2017-12-20 LAB — T4, FREE: Free T4: 0.66 ng/dL (ref 0.60–1.60)

## 2017-12-20 LAB — T3, FREE: T3 FREE: 3.1 pg/mL (ref 2.3–4.2)

## 2017-12-20 LAB — TSH: TSH: 1.88 u[IU]/mL (ref 0.35–4.50)

## 2017-12-20 MED ORDER — ATENOLOL 25 MG PO TABS: 25.0000 mg | ORAL_TABLET | Freq: Every day | ORAL | 3 refills | Status: AC

## 2017-12-20 MED ORDER — METHIMAZOLE 5 MG PO TABS: 2.5000 mg | ORAL_TABLET | Freq: Every day | ORAL | 2 refills | Status: DC

## 2017-12-20 NOTE — Patient Instructions (Addendum)
Please stop at the lab.  For now, please continue methimazole 5 mg once a day.  Also, continue atenolol 25 mg daily.  Please come back for a follow-up appointment in 6 months.

## 2017-12-20 NOTE — Progress Notes (Addendum)
Patient ID: Carmen Henderson, female   DOB: Apr 01, 1970, 47 y.o.   MRN: [id number]    HPI  Carmen Henderson is a 47 y.o.-year-old female, initially referred by her PCP, Dr. Charlton Amor, returning for follow-up for thyrotoxicosis, most likely Graves' disease.  Last visit a year ago.  Since last visit, she had increased vaginal bleeding 2/2 fibroids >> hospitalized in 10/2017. She had a Hb 7.9 then and she was in shock. Given 2 units of blood and started on iron and Megace - now prn. She will have a TAH on 02/03/2018.   We added methimazole 5 mg twice a day and atenolol 25 mg daily at last visit.  We decreased the dose to methimazole 5 mg once a day in 03/2017.  Her TFTs were normal 1 month ago and she continued the same dose of methimazole.  Reviewed patient's TFTs: Lab Results  Component Value Date   TSH 4.33 11/11/2017   TSH 1.755 11/05/2017   TSH 1.87 06/14/2017   TSH 4.18 04/01/2017   TSH 0.10 (L) 02/17/2017   TSH 0.03 (L) 01/14/2017   FREET4 0.68 11/11/2017   FREET4 0.65 06/14/2017   FREET4 0.55 (L) 04/01/2017   FREET4 0.68 02/17/2017   FREET4 0.92 01/14/2017  12/25/2016: TSH 0.188 (0.45-5.33), free T4 0.8 (0.6-1.4), free T3 4.51 (2.3-4.2), TPO Abs 834.2, ATA <1.8 12/04/2015: TSH 2.129  Her TSI antibodies were elevated: Lab Results  Component Value Date   TSI 446 (H) 02/17/2017   Pt denies: - weight loss, but complains of weight gain - heat intolerance - tremors - palpitations - anxiety - hyperdefecation - hair loss  She has significant fatigue.  Pt denies: - feeling nodules in neck - hoarseness - dysphagia - choking - SOB with lying down  Pt does have a FH of thyroid ds.: daughter and mother. No FH of thyroid cancer. No h/o radiation tx to head or neck.  No recent contrast studies. No herbal supplements. Stopped Biotin this last visit. No recent steroids use.   Pt. also has a history of migraines.  ROS: Constitutional:+ see HPI Eyes: no blurry  vision, no xerophthalmia ENT: no sore throat, + see HPI Cardiovascular: no CP/no SOB/no palpitations/no leg swelling Respiratory: no cough/no SOB/no wheezing Gastrointestinal: no N/no V/no D/no C/no acid reflux Musculoskeletal: no muscle aches/no joint aches Skin: no rashes, no hair loss Neurological: no tremors/no numbness/no tingling/no dizziness  I reviewed pt's medications, allergies, PMH, social hx, family hx, and changes were documented in the history of present illness. Otherwise, unchanged from my initial visit note.  Past Medical History:  Diagnosis Date  . Fibroids   . Graves disease   . Headache   . Migraine    Past Surgical History:  Procedure Laterality Date  . CARPAL TUNNEL RELEASE Right   . CESAREAN SECTION     x2  . FOOT SURGERY Left   . IR GENERIC HISTORICAL  05/29/2014   IR RADIOLOGIST EVAL & MGMT 05/29/2014 Sandi Mariscal, MD [facility] INTERV RAD  . KNEE ARTHROPLASTY Right    Social History   Social History  . Marital status: Single    Spouse name: N/A  . Number of children: 2   Occupational History  . Food and Lawyer   Social History Main Topics  . Smoking status: Never Smoker  . Smokeless tobacco: Never Used  . Alcohol use No  . Drug use: No   Current Outpatient Medications on File Prior to Visit  Medication Sig Dispense  Refill  . aspirin-acetaminophen-caffeine (EXCEDRIN MIGRAINE) 250-250-65 MG per tablet Take 2 tablets by mouth every 6 (six) hours as needed for headache.    . Biotin 1 MG CAPS Take by mouth.    . ferrous sulfate 325 (65 FE) MG EC tablet Take 325 mg by mouth daily.    . methimazole (TAPAZOLE) 5 MG tablet Take 1 tablet (5 mg total) by mouth daily. With meals. 60 tablet 2  . methimazole (TAPAZOLE) 5 MG tablet TAKE 1 TABLET BY MOUTH 2 TIMES DAILY. WITH MEALS. 90 tablet 2  . Multiple Vitamin (MULTIVITAMIN WITH MINERALS) TABS tablet Take 1 tablet by mouth daily.    . nortriptyline (PAMELOR) 10 MG capsule Take 10 mg by mouth.    .  TROKENDI XR 50 MG CP24     . zolmitriptan (ZOMIG-ZMT) 2.5 MG disintegrating tablet Take 2.5 mg by mouth.     No current facility-administered medications on file prior to visit.    No Known Allergies  FH: DM in sisters and mother HTN in mother and sister Heart ds in mother Br CA in mother, leukemia in father + see HPI  PE: BP 126/74   Pulse (!) 110   Ht 5' (1.524 m)   Wt 261 lb (118.4 kg)   SpO2 98%   BMI 50.97 kg/m  Wt Readings from Last 3 Encounters:  12/20/17 261 lb (118.4 kg)  11/05/17 258 lb 8 oz (117.3 kg)  11/03/17 248 lb (112.5 kg)   Constitutional: overweight, in NAD Eyes: PERRLA, EOMI, no exophthalmos, no lid lag, no stare ENT: moist mucous membranes, no thyromegaly, no cervical lymphadenopathy Cardiovascular: RRR, No MRG Respiratory: CTA B Gastrointestinal: abdomen soft, NT, ND, BS+ Musculoskeletal: no deformities, strength intact in all 4 Skin: moist, warm, no rashes Neurological: no tremor with outstretched hands, DTR normal in all 4  ASSESSMENT: 1.  Graves' disease  PLAN:  1.  Patient with history of mildly low TSH with high free T3, without thyrotoxic symptoms: No weight loss, heat intolerance, hyper defecation, palpitations, anxiety, but with a rapid heart rate.  As noticed above, her heart rate may be related to chronic blood loss and anemia.  The year prior, TSH was normal.   - At last visit, we discussed about the fact that biotin can interfere with the thyroid hormone assay, as she was on biotin at the time of the lab draw however, we also discussed about other possible causes for thyrotoxicosis, to include Graves' disease, thyroiditis, toxic nodular goiter.  However, at that time, her TSI antibodies were found to be elevated, therefore, our working diagnosis is Graves' disease. -We started methimazole 5 mg twice a day and atenolol 25 mg daily at last visit.  In 03/2017 we were able to decrease the methimazole dose to 5 mg once a day. -Latest set of  labs were reviewed with the patient: TSH was at the upper limit of normal.  My colleague continued methimazole 5 mg daily. -At this visit, we again discussed about possible modalities of treatment for thyrotoxicosis, depending on the etiology, to include methimazole use, radioactive iodine ablation, or, last resort, surgery. -For now, she responded very well to methimazole, so we will continue this, however, I would recheck her TFTs today since I suspect that we can safely decrease the dose now. -We will continue atenolol for now, since her heart rate is 110 today. -I will see her back in 6 months, but we will have her back for labs sooner -Given flu shot today  -  time spent with the patient: 15 minutes, of which >50% was spent in obtaining information about her symptoms, reviewing her previous labs, evaluations, and treatments, counseling her about her condition (please see the discussed topics above), and developing a plan to further investigate and treat it.  Component     Latest Ref Rng & Units 12/20/2017  TSH     0.35 - 4.50 uIU/mL 1.88  T4,Free(Direct)     0.60 - 1.60 ng/dL 0.66  Triiodothyronine,Free,Serum     2.3 - 4.2 pg/mL 3.1  TSI     <140 % baseline 119   Thyroid tests are excellent.  I suggested to continue decrease methimazole to 2.5 mg daily and repeat the labs in 1.5 months.  Philemon Kingdom, MD PhD [facility] Endocrinology

## 2017-12-23 LAB — THYROID STIMULATING IMMUNOGLOBULIN: TSI: 119 %{baseline} (ref <140)

## 2018-01-04 ENCOUNTER — Encounter: Payer: Self-pay | Admitting: Internal Medicine

## 2018-01-14 ENCOUNTER — Other Ambulatory Visit: Payer: Self-pay | Admitting: Obstetrics & Gynecology

## 2018-01-18 ENCOUNTER — Encounter: Payer: Self-pay | Admitting: Internal Medicine

## 2018-01-19 NOTE — Patient Instructions (Addendum)
Your procedure is scheduled on:  Thursday, 02/03/18   Enter through the Main Entrance of [facility] at: 11:30 am  Pick up the phone at the desk and dial [phone].  Call this number if you have problems the morning of surgery: [phone].  Remember: Do NOT eat food after midnight Wednesday  Do NOT drink clear liquids (including water) after 7 am Thursday, day of surgery  Take these medicines the morning of surgery with a SIP OF WATER:  tapazole  Brush your teeth on the day of surgery.  Stop herbal medications, vitamin supplements, Ibuprofen/NSAIDS 1 week prior to surgery- Friday 11/8.  Do NOT wear jewelry (body piercing), metal hair clips/bobby pins, make-up, or nail polish. Do NOT wear lotions, powders, or perfumes.  You may wear deoderant. Do NOT shave for 48 hours prior to surgery. Do NOT bring valuables to the hospital.  Leave suitcase in car.  After surgery it may be brought to your room.  For patients admitted to the hospital, checkout time is 11:00 AM the day of discharge. Have a responsible adult drive you home and stay with you for 24 hours after your procedure.  Home with Boyfriend Derrick cell [phone].

## 2018-01-21 ENCOUNTER — Encounter (HOSPITAL_COMMUNITY)
Admission: RE | Admit: 2018-01-21 | Discharge: 2018-01-21 | Disposition: A | Discharge status: Home / Self Care | Payer: PRIVATE HEALTH INSURANCE | Source: Ambulatory Visit | Attending: Obstetrics & Gynecology | Admitting: Obstetrics & Gynecology

## 2018-01-21 ENCOUNTER — Other Ambulatory Visit: Payer: Self-pay

## 2018-01-21 ENCOUNTER — Encounter (HOSPITAL_COMMUNITY): Payer: Self-pay

## 2018-01-21 DIAGNOSIS — Z01812 Encounter for preprocedural laboratory examination: Secondary | ICD-10-CM | POA: Diagnosis not present

## 2018-01-21 HISTORY — DX: Other seasonal allergic rhinitis: J30.2

## 2018-01-21 HISTORY — DX: Anemia, unspecified: D64.9

## 2018-01-21 HISTORY — DX: Cardiac arrhythmia, unspecified: I49.9

## 2018-01-21 HISTORY — DX: Hypothyroidism, unspecified: E03.9

## 2018-01-21 HISTORY — DX: Personal history of other medical treatment: Z92.89

## 2018-01-21 LAB — TYPE AND SCREEN
ABO/RH(D): O POS
Antibody Screen: POSITIVE
DAT, IGG: NEGATIVE
PT AG TYPE: NEGATIVE

## 2018-01-21 LAB — COMPREHENSIVE METABOLIC PANEL
ALK PHOS: 76 U/L (ref 38–126)
ALT: 21 U/L (ref 0–44)
ANION GAP: 7 (ref 5–15)
AST: 18 U/L (ref 15–41)
Albumin: 4 g/dL (ref 3.5–5.0)
BUN: 11 mg/dL (ref 6–20)
CALCIUM: 8.5 mg/dL — AB (ref 8.9–10.3)
CHLORIDE: 106 mmol/L (ref 98–111)
CO2: 25 mmol/L (ref 22–32)
Creatinine, Ser: 0.75 mg/dL (ref 0.44–1.00)
GFR calc Af Amer: >60 mL/min (ref >60)
GFR calc non Af Amer: >60 mL/min (ref >60)
GLUCOSE: 129 mg/dL — AB (ref 70–99)
Potassium: 3.7 mmol/L (ref 3.5–5.1)
SODIUM: 138 mmol/L (ref 135–145)
Total Bilirubin: 0.7 mg/dL (ref 0.3–1.2)
Total Protein: 7.6 g/dL (ref 6.5–8.1)

## 2018-01-21 LAB — CBC
HCT: 40.3 % (ref 36.0–46.0)
HEMOGLOBIN: 13.3 g/dL (ref 12.0–15.0)
MCH: 31.7 pg (ref 26.0–34.0)
MCHC: 33 g/dL (ref 30.0–36.0)
MCV: 96 fL (ref 80.0–100.0)
PLATELETS: 200 10*3/uL (ref 150–400)
RBC: 4.2 MIL/uL (ref 3.87–5.11)
RDW: 13.5 % (ref 11.5–15.5)
WBC: 3.6 10*3/uL — ABNORMAL LOW (ref 4.0–10.5)

## 2018-01-21 NOTE — Pre-Procedure Instructions (Signed)
Patient is not eligible for 14 day T&S.  History of Blood Transfusion in 10/2017.  Will collect T&S on DOS.  SDS BB History Log sent to Lab for history of blood transfusion at [facility] 10/2017, 2 units transfused.

## 2018-02-02 ENCOUNTER — Other Ambulatory Visit (HOSPITAL_COMMUNITY)
Admission: AD | Admit: 2018-02-02 | Discharge: 2018-02-02 | Disposition: A | Discharge status: Home / Self Care | Payer: No Typology Code available for payment source | Source: Ambulatory Visit | Attending: Obstetrics & Gynecology | Admitting: Obstetrics & Gynecology

## 2018-02-03 ENCOUNTER — Inpatient Hospital Stay (HOSPITAL_COMMUNITY)
Admission: RE | Admit: 2018-02-03 | Discharge: 2018-02-05 | DRG: 743 | Disposition: A | Discharge status: Home / Self Care | Payer: PRIVATE HEALTH INSURANCE | Attending: Obstetrics & Gynecology | Admitting: Obstetrics & Gynecology

## 2018-02-03 ENCOUNTER — Encounter (HOSPITAL_COMMUNITY): Payer: Self-pay

## 2018-02-03 ENCOUNTER — Encounter (HOSPITAL_COMMUNITY)
Admission: RE | Disposition: A | Discharge status: Home / Self Care | Payer: Self-pay | Source: Home / Self Care | Attending: Obstetrics & Gynecology

## 2018-02-03 ENCOUNTER — Other Ambulatory Visit: Payer: Self-pay

## 2018-02-03 ENCOUNTER — Inpatient Hospital Stay (HOSPITAL_COMMUNITY): Payer: PRIVATE HEALTH INSURANCE | Admitting: Anesthesiology

## 2018-02-03 DIAGNOSIS — D649 Anemia, unspecified: Secondary | ICD-10-CM | POA: Diagnosis present

## 2018-02-03 DIAGNOSIS — D259 Leiomyoma of uterus, unspecified: Secondary | ICD-10-CM | POA: Diagnosis present

## 2018-02-03 DIAGNOSIS — I10 Essential (primary) hypertension: Secondary | ICD-10-CM | POA: Diagnosis present

## 2018-02-03 DIAGNOSIS — Z90710 Acquired absence of both cervix and uterus: Secondary | ICD-10-CM | POA: Diagnosis present

## 2018-02-03 DIAGNOSIS — N83202 Unspecified ovarian cyst, left side: Secondary | ICD-10-CM | POA: Diagnosis present

## 2018-02-03 DIAGNOSIS — N736 Female pelvic peritoneal adhesions (postinfective): Secondary | ICD-10-CM | POA: Diagnosis present

## 2018-02-03 DIAGNOSIS — N920 Excessive and frequent menstruation with regular cycle: Secondary | ICD-10-CM | POA: Diagnosis present

## 2018-02-03 HISTORY — PX: HYSTERECTOMY ABDOMINAL WITH SALPINGECTOMY: SHX6725

## 2018-02-03 LAB — CBC
HEMATOCRIT: 36.9 % (ref 36.0–46.0)
HEMOGLOBIN: 12.1 g/dL (ref 12.0–15.0)
MCH: 31.3 pg (ref 26.0–34.0)
MCHC: 32.8 g/dL (ref 30.0–36.0)
MCV: 95.3 fL (ref 80.0–100.0)
Platelets: 197 10*3/uL (ref 150–400)
RBC: 3.87 MIL/uL (ref 3.87–5.11)
RDW: 13.7 % (ref 11.5–15.5)
WBC: 10.5 10*3/uL (ref 4.0–10.5)
nRBC: 0 % (ref 0.0–0.2)

## 2018-02-03 LAB — HCG, SERUM, QUALITATIVE: PREG SERUM: NEGATIVE

## 2018-02-03 LAB — COMPREHENSIVE METABOLIC PANEL
ALT: 14 U/L (ref 0–44)
AST: 22 U/L (ref 15–41)
Albumin: 3.5 g/dL (ref 3.5–5.0)
Alkaline Phosphatase: 59 U/L (ref 38–126)
Anion gap: 12 (ref 5–15)
BILIRUBIN TOTAL: 0.4 mg/dL (ref 0.3–1.2)
BUN: 9 mg/dL (ref 6–20)
CO2: 22 mmol/L (ref 22–32)
CREATININE: 0.91 mg/dL (ref 0.44–1.00)
Calcium: 7.9 mg/dL — ABNORMAL LOW (ref 8.9–10.3)
Chloride: 102 mmol/L (ref 98–111)
GFR calc Af Amer: >60 mL/min (ref >60)
Glucose, Bld: 192 mg/dL — ABNORMAL HIGH (ref 70–99)
Potassium: 3.3 mmol/L — ABNORMAL LOW (ref 3.5–5.1)
Sodium: 136 mmol/L (ref 135–145)
TOTAL PROTEIN: 6.5 g/dL (ref 6.5–8.1)

## 2018-02-03 SURGERY — HYSTERECTOMY, TOTAL, ABDOMINAL, WITH SALPINGECTOMY
Anesthesia: General | Site: Abdomen | Laterality: Bilateral

## 2018-02-03 MED ORDER — BUPIVACAINE HCL (PF) 0.25 % IJ SOLN
INTRAMUSCULAR | Status: AC
  Filled 2018-02-03: qty 30

## 2018-02-03 MED ORDER — LACTATED RINGERS IV SOLN
INTRAVENOUS | Status: DC
  Administered 2018-02-03: 21:00:00 via INTRAVENOUS

## 2018-02-03 MED ORDER — ACETAMINOPHEN 160 MG/5ML PO SOLN: 325.0000 mg | ORAL | Status: DC | PRN

## 2018-02-03 MED ORDER — MENTHOL 3 MG MT LOZG: 1.0000 | LOZENGE | OROMUCOSAL | Status: DC | PRN

## 2018-02-03 MED ORDER — ATENOLOL 25 MG PO TABS
12.5000 mg | ORAL_TABLET | Freq: Every day | ORAL | Status: DC
  Administered 2018-02-03 – 2018-02-04 (×2): 12.5 mg via ORAL
  Filled 2018-02-03 (×3): qty 0.5

## 2018-02-03 MED ORDER — ONDANSETRON HCL 4 MG/2ML IJ SOLN
INTRAMUSCULAR | Status: AC
  Administered 2018-02-03: 4 mg via INTRAVENOUS
  Filled 2018-02-03: qty 2

## 2018-02-03 MED ORDER — SUGAMMADEX SODIUM 200 MG/2ML IV SOLN
INTRAVENOUS | Status: DC | PRN
  Administered 2018-02-03: 237.2 mg via INTRAVENOUS

## 2018-02-03 MED ORDER — DEXMEDETOMIDINE HCL IN NACL 200 MCG/50ML IV SOLN
INTRAVENOUS | Status: AC
  Filled 2018-02-03: qty 50

## 2018-02-03 MED ORDER — OXYCODONE HCL 5 MG/5ML PO SOLN: 5.0000 mg | Freq: Once | ORAL | Status: DC | PRN

## 2018-02-03 MED ORDER — ONDANSETRON HCL 4 MG/2ML IJ SOLN
INTRAMUSCULAR | Status: DC | PRN
  Administered 2018-02-03 (×2): 4 mg via INTRAVENOUS

## 2018-02-03 MED ORDER — PROPOFOL 10 MG/ML IV BOLUS
INTRAVENOUS | Status: DC | PRN
  Administered 2018-02-03: 200 mg via INTRAVENOUS

## 2018-02-03 MED ORDER — EPHEDRINE SULFATE 50 MG/ML IJ SOLN
INTRAMUSCULAR | Status: DC | PRN
  Administered 2018-02-03 (×2): 5 mg via INTRAVENOUS

## 2018-02-03 MED ORDER — SUGAMMADEX SODIUM 200 MG/2ML IV SOLN
INTRAVENOUS | Status: AC
  Filled 2018-02-03: qty 4

## 2018-02-03 MED ORDER — ASPIRIN-ACETAMINOPHEN-CAFFEINE 250-250-65 MG PO TABS: 2.0000 | ORAL_TABLET | Freq: Four times a day (QID) | ORAL | Status: DC | PRN

## 2018-02-03 MED ORDER — PHENYLEPHRINE 40 MCG/ML (10ML) SYRINGE FOR IV PUSH (FOR BLOOD PRESSURE SUPPORT)
PREFILLED_SYRINGE | INTRAVENOUS | Status: DC | PRN
  Administered 2018-02-03 (×4): 40 ug via INTRAVENOUS

## 2018-02-03 MED ORDER — ACETAMINOPHEN 325 MG PO TABS: 325.0000 mg | ORAL_TABLET | ORAL | Status: DC | PRN

## 2018-02-03 MED ORDER — ONDANSETRON HCL 4 MG/2ML IJ SOLN: 4.0000 mg | Freq: Four times a day (QID) | INTRAMUSCULAR | Status: DC | PRN

## 2018-02-03 MED ORDER — METHIMAZOLE 5 MG PO TABS
2.5000 mg | ORAL_TABLET | Freq: Every day | ORAL | Status: DC
  Administered 2018-02-04 – 2018-02-05 (×2): 2.5 mg via ORAL
  Filled 2018-02-03 (×3): qty 1

## 2018-02-03 MED ORDER — DEXMEDETOMIDINE HCL 200 MCG/2ML IV SOLN
INTRAVENOUS | Status: DC | PRN
  Administered 2018-02-03: 32 ug via INTRAVENOUS

## 2018-02-03 MED ORDER — BUPIVACAINE HCL (PF) 0.25 % IJ SOLN
INTRAMUSCULAR | Status: DC | PRN
  Administered 2018-02-03: 20 mL

## 2018-02-03 MED ORDER — FENTANYL CITRATE (PF) 250 MCG/5ML IJ SOLN
INTRAMUSCULAR | Status: AC
  Filled 2018-02-03: qty 5

## 2018-02-03 MED ORDER — GLYCOPYRROLATE 0.2 MG/ML IJ SOLN
INTRAMUSCULAR | Status: AC
  Filled 2018-02-03: qty 1

## 2018-02-03 MED ORDER — SCOPOLAMINE 1 MG/3DAYS TD PT72
1.0000 | MEDICATED_PATCH | Freq: Once | TRANSDERMAL | Status: DC
  Administered 2018-02-03: 1.5 mg via TRANSDERMAL

## 2018-02-03 MED ORDER — LABETALOL HCL 5 MG/ML IV SOLN
INTRAVENOUS | Status: DC | PRN
  Administered 2018-02-03: 10 mg via INTRAVENOUS

## 2018-02-03 MED ORDER — MEPERIDINE HCL 25 MG/ML IJ SOLN: 6.2500 mg | INTRAMUSCULAR | Status: DC | PRN

## 2018-02-03 MED ORDER — MIDAZOLAM HCL 2 MG/2ML IJ SOLN
INTRAMUSCULAR | Status: AC
  Filled 2018-02-03: qty 2

## 2018-02-03 MED ORDER — FENTANYL CITRATE (PF) 100 MCG/2ML IJ SOLN: 25.0000 ug | INTRAMUSCULAR | Status: DC | PRN

## 2018-02-03 MED ORDER — MIDAZOLAM HCL 2 MG/2ML IJ SOLN
INTRAMUSCULAR | Status: DC | PRN
  Administered 2018-02-03: 2 mg via INTRAVENOUS

## 2018-02-03 MED ORDER — ONDANSETRON HCL 4 MG PO TABS: 4.0000 mg | ORAL_TABLET | Freq: Four times a day (QID) | ORAL | Status: DC | PRN

## 2018-02-03 MED ORDER — KETOROLAC TROMETHAMINE 30 MG/ML IJ SOLN
INTRAMUSCULAR | Status: AC
  Filled 2018-02-03: qty 1

## 2018-02-03 MED ORDER — SUMATRIPTAN SUCCINATE 50 MG PO TABS
50.0000 mg | ORAL_TABLET | ORAL | Status: DC | PRN
  Filled 2018-02-03: qty 1

## 2018-02-03 MED ORDER — LACTATED RINGERS IV SOLN
INTRAVENOUS | Status: DC
  Administered 2018-02-03: 14:00:00 via INTRAVENOUS
  Administered 2018-02-03: 125 mL/h via INTRAVENOUS
  Administered 2018-02-03: 16:00:00 via INTRAVENOUS

## 2018-02-03 MED ORDER — ZOLMITRIPTAN 2.5 MG PO TBDP: 2.5000 mg | ORAL_TABLET | ORAL | Status: DC | PRN

## 2018-02-03 MED ORDER — DEXAMETHASONE SODIUM PHOSPHATE 4 MG/ML IJ SOLN
INTRAMUSCULAR | Status: AC
  Filled 2018-02-03: qty 1

## 2018-02-03 MED ORDER — OXYCODONE HCL 5 MG PO TABS: 5.0000 mg | ORAL_TABLET | Freq: Once | ORAL | Status: DC | PRN

## 2018-02-03 MED ORDER — ROCURONIUM BROMIDE 100 MG/10ML IV SOLN
INTRAVENOUS | Status: DC | PRN
  Administered 2018-02-03: 50 mg via INTRAVENOUS
  Administered 2018-02-03 (×4): 10 mg via INTRAVENOUS

## 2018-02-03 MED ORDER — CEFAZOLIN SODIUM-DEXTROSE 2-4 GM/100ML-% IV SOLN
INTRAVENOUS | Status: AC
  Filled 2018-02-03: qty 100

## 2018-02-03 MED ORDER — PHENYLEPHRINE 40 MCG/ML (10ML) SYRINGE FOR IV PUSH (FOR BLOOD PRESSURE SUPPORT)
PREFILLED_SYRINGE | INTRAVENOUS | Status: AC
  Filled 2018-02-03: qty 10

## 2018-02-03 MED ORDER — GLYCOPYRROLATE 0.2 MG/ML IJ SOLN
INTRAMUSCULAR | Status: DC | PRN
  Administered 2018-02-03: 0.1 mg via INTRAVENOUS

## 2018-02-03 MED ORDER — CEFAZOLIN SODIUM-DEXTROSE 2-4 GM/100ML-% IV SOLN
2.0000 g | INTRAVENOUS | Status: AC
  Administered 2018-02-03: 1 g via INTRAVENOUS
  Administered 2018-02-03: 2 g via INTRAVENOUS

## 2018-02-03 MED ORDER — FENTANYL CITRATE (PF) 100 MCG/2ML IJ SOLN
INTRAMUSCULAR | Status: DC | PRN
  Administered 2018-02-03: 100 ug via INTRAVENOUS
  Administered 2018-02-03: 50 ug via INTRAVENOUS
  Administered 2018-02-03: 100 ug via INTRAVENOUS

## 2018-02-03 MED ORDER — ONDANSETRON HCL 4 MG/2ML IJ SOLN
INTRAMUSCULAR | Status: AC
  Filled 2018-02-03: qty 2

## 2018-02-03 MED ORDER — ONDANSETRON HCL 4 MG/2ML IJ SOLN
4.0000 mg | Freq: Once | INTRAMUSCULAR | Status: AC | PRN
  Administered 2018-02-03: 4 mg via INTRAVENOUS

## 2018-02-03 MED ORDER — SODIUM CHLORIDE 0.9% FLUSH: 9.0000 mL | INTRAVENOUS | Status: DC | PRN

## 2018-02-03 MED ORDER — FERROUS SULFATE 325 (65 FE) MG PO TABS
325.0000 mg | ORAL_TABLET | Freq: Every day | ORAL | Status: DC
  Administered 2018-02-04 – 2018-02-05 (×2): 325 mg via ORAL
  Filled 2018-02-03 (×2): qty 1

## 2018-02-03 MED ORDER — DIPHENHYDRAMINE HCL 12.5 MG/5ML PO ELIX: 12.5000 mg | ORAL_SOLUTION | Freq: Four times a day (QID) | ORAL | Status: DC | PRN

## 2018-02-03 MED ORDER — HYDROMORPHONE HCL 1 MG/ML IJ SOLN
INTRAMUSCULAR | Status: AC
  Filled 2018-02-03: qty 1

## 2018-02-03 MED ORDER — NALOXONE HCL 0.4 MG/ML IJ SOLN: 0.4000 mg | INTRAMUSCULAR | Status: DC | PRN

## 2018-02-03 MED ORDER — HYDROMORPHONE 1 MG/ML IV SOLN
INTRAVENOUS | Status: DC
  Administered 2018-02-03: 30 mg via INTRAVENOUS
  Administered 2018-02-03: 1.5 mg via INTRAVENOUS
  Administered 2018-02-04: 0.2 mg via INTRAVENOUS
  Administered 2018-02-04: 1.2 mg via INTRAVENOUS
  Administered 2018-02-04: 0.4 mg via INTRAVENOUS
  Filled 2018-02-03: qty 30

## 2018-02-03 MED ORDER — POTASSIUM CHLORIDE 10 MEQ/100ML IV SOLN
10.0000 meq | Freq: Once | INTRAVENOUS | Status: AC
  Administered 2018-02-03: 10 meq via INTRAVENOUS
  Filled 2018-02-03: qty 100

## 2018-02-03 MED ORDER — HYDROMORPHONE HCL 1 MG/ML IJ SOLN
INTRAMUSCULAR | Status: DC | PRN
  Administered 2018-02-03 (×4): 0.5 mg via INTRAVENOUS

## 2018-02-03 MED ORDER — GLYCOPYRROLATE 0.2 MG/ML IJ SOLN
INTRAMUSCULAR | Status: AC
  Filled 2018-02-03: qty 3

## 2018-02-03 MED ORDER — SCOPOLAMINE 1 MG/3DAYS TD PT72
MEDICATED_PATCH | TRANSDERMAL | Status: AC
  Administered 2018-02-03: 1.5 mg via TRANSDERMAL
  Filled 2018-02-03: qty 1

## 2018-02-03 MED ORDER — ROCURONIUM BROMIDE 100 MG/10ML IV SOLN
INTRAVENOUS | Status: AC
  Filled 2018-02-03: qty 1

## 2018-02-03 MED ORDER — PROMETHAZINE HCL 25 MG/ML IJ SOLN
25.0000 mg | Freq: Four times a day (QID) | INTRAMUSCULAR | Status: DC | PRN
  Administered 2018-02-03: 25 mg via INTRAVENOUS
  Filled 2018-02-03: qty 1

## 2018-02-03 MED ORDER — LIDOCAINE HCL (CARDIAC) PF 100 MG/5ML IV SOSY
PREFILLED_SYRINGE | INTRAVENOUS | Status: AC
  Filled 2018-02-03: qty 5

## 2018-02-03 MED ORDER — PROPOFOL 10 MG/ML IV BOLUS
INTRAVENOUS | Status: AC
  Filled 2018-02-03: qty 20

## 2018-02-03 MED ORDER — DIPHENHYDRAMINE HCL 50 MG/ML IJ SOLN: 12.5000 mg | Freq: Four times a day (QID) | INTRAMUSCULAR | Status: DC | PRN

## 2018-02-03 MED ORDER — ONDANSETRON HCL 4 MG/2ML IJ SOLN
4.0000 mg | Freq: Four times a day (QID) | INTRAMUSCULAR | Status: DC | PRN
  Filled 2018-02-03: qty 2

## 2018-02-03 MED ORDER — LABETALOL HCL 5 MG/ML IV SOLN
INTRAVENOUS | Status: AC
  Filled 2018-02-03: qty 8

## 2018-02-03 MED ORDER — TOPIRAMATE ER 50 MG PO CAP24: 50.0000 mg | ORAL_CAPSULE | Freq: Every day | ORAL | Status: DC

## 2018-02-03 MED ORDER — DEXAMETHASONE SODIUM PHOSPHATE 10 MG/ML IJ SOLN
INTRAMUSCULAR | Status: DC | PRN
  Administered 2018-02-03: 4 mg via INTRAVENOUS

## 2018-02-03 MED ORDER — LIDOCAINE HCL (CARDIAC) PF 100 MG/5ML IV SOSY
PREFILLED_SYRINGE | INTRAVENOUS | Status: DC | PRN
  Administered 2018-02-03: 100 mg via INTRAVENOUS

## 2018-02-03 SURGICAL SUPPLY — 39 items
BLADE SURG 15 STRL LF C SS BP (BLADE) IMPLANT
BLADE SURG 15 STRL SS (BLADE) ×3
CANISTER SUCT 3000ML PPV (MISCELLANEOUS) ×3 IMPLANT
CONT PATH 16OZ SNAP LID 3702 (MISCELLANEOUS) ×3 IMPLANT
DRAPE CESAREAN BIRTH W POUCH (DRAPES) ×3 IMPLANT
DRAPE WARM FLUID 44X44 (DRAPE) ×2 IMPLANT
DRSG OPSITE POSTOP 4X10 (GAUZE/BANDAGES/DRESSINGS) ×3 IMPLANT
DURAPREP 26ML APPLICATOR (WOUND CARE) ×3 IMPLANT
ELECT BLADE 6.5 EXT (BLADE) ×2 IMPLANT
GAUZE 4X4 16PLY RFD (DISPOSABLE) ×2 IMPLANT
GLOVE BIO SURGEON STRL SZ7 (GLOVE) ×3 IMPLANT
GLOVE BIOGEL PI IND STRL 7.0 (GLOVE) ×3 IMPLANT
GLOVE BIOGEL PI INDICATOR 7.0 (GLOVE) ×6
GOWN STRL REUS W/TWL LRG LVL3 (GOWN DISPOSABLE) ×9 IMPLANT
LIGASURE IMPACT 36 18CM CVD LR (INSTRUMENTS) IMPLANT
NEEDLE HYPO 22GX1.5 SAFETY (NEEDLE) ×3 IMPLANT
NS IRRIG 1000ML POUR BTL (IV SOLUTION) ×3 IMPLANT
PACK ABDOMINAL GYN (CUSTOM PROCEDURE TRAY) ×3 IMPLANT
PAD OB MATERNITY 4.3X12.25 (PERSONAL CARE ITEMS) ×3 IMPLANT
PROTECTOR NERVE ULNAR (MISCELLANEOUS) ×3 IMPLANT
SPONGE LAP 18X18 RF (DISPOSABLE) ×10 IMPLANT
STAPLER VISISTAT 35W (STAPLE) IMPLANT
SUT PLAIN 2 0 XLH (SUTURE) ×2 IMPLANT
SUT PROLENE 0 CT 1 30 (SUTURE) IMPLANT
SUT VIC AB 0 CT1 18XCR BRD8 (SUTURE) ×3 IMPLANT
SUT VIC AB 0 CT1 36 (SUTURE) ×22 IMPLANT
SUT VIC AB 0 CT1 8-18 (SUTURE) ×9
SUT VIC AB 2-0 CT1 27 (SUTURE) ×3
SUT VIC AB 2-0 CT1 TAPERPNT 27 (SUTURE) IMPLANT
SUT VIC AB 2-0 SH 27 (SUTURE) ×9
SUT VIC AB 2-0 SH 27XBRD (SUTURE) IMPLANT
SUT VIC AB 3-0 SH 27 (SUTURE) ×9
SUT VIC AB 3-0 SH 27X BRD (SUTURE) IMPLANT
SUT VIC AB 4-0 KS 27 (SUTURE) ×2 IMPLANT
SUT VIC AB 4-0 SH 18 (SUTURE) IMPLANT
SUT VICRYL 0 TIES 12 18 (SUTURE) ×3 IMPLANT
SYR CONTROL 10ML LL (SYRINGE) ×3 IMPLANT
TOWEL OR 17X24 6PK STRL BLUE (TOWEL DISPOSABLE) ×6 IMPLANT
TRAY FOLEY W/BAG SLVR 14FR (SET/KITS/TRAYS/PACK) ×3 IMPLANT

## 2018-02-03 NOTE — H&P (Addendum)
47 year old G2 P2, nonpregnant with known history of fibroids and prior history of menorrhagia, previously controlled with uterine artery embolization in 2016 presented few months back with heavy vaginal bleeding and symptomatic anemia needing blood transfusion, bleeding is controlled well with Megace now.   Sono-  uterus 18.2 x 7.3 x 10.2 with a 14 mm endometrial stripe.  Past medical history: Obesity, hyperthyroidism, hypertension  Family history: Mom with breast cancer  Medications: Methimazole, Megace 40 mg daily  Past surgical history: Cesarean section 2  PE:  BP (!) 148/102   Pulse 85   Temp 98 F (36.7 C) (Oral)   Resp 16   SpO2 100%   General: Obese woman lying in bed, no acute distress but breathing appears labored Cardiovascular: Regular rhythm, tachycardic, no murmur rub or gallop Pulmonary: Clear to auscultation bilaterally Abdomen: Obese, no right upper quadrant pain, no distention, no tympany, no tenderness. Uterus at umbilicus GU: Deferred as just done by nurse practitioner Lower extremity: Trace edema, nontender  CBC Latest Ref Rng & Units 01/21/2018 11/06/2017 11/05/2017  WBC 4.0 - 10.5 K/uL 3.6(L) 7.0 6.3  Hemoglobin 12.0 - 15.0 g/dL 13.3 9.7(L) 7.9(L)  Hematocrit 36.0 - 46.0 % 40.3 28.8(L) 22.9(L)  Platelets 150 - 400 K/uL 200 157 175    Assessment and plan:47 year old G2 P2, with large fibroid uterus, menorrhagia and anemia. Here for hysterectomy and bilateral salpingectomy   Risks/complications of surgery reviewed incl infection, bleeding, damage to internal organs including bladder, bowels, ureters, blood vessels, other risks from anesthesia, VTE and delayed complications of any surgery, complications in future surgery reviewed. Also discussed neonatal complications incl difficult delivery, laceration, vacuum assistance, TTN etc. Pt understands and agrees, all concerns addressed.

## 2018-02-03 NOTE — Anesthesia Procedure Notes (Signed)
Procedure Name: Intubation Date/Time: 02/03/2018 1:24 PM Performed by: Hewitt Blade, CRNA Pre-anesthesia Checklist: Patient identified, Emergency Drugs available, Suction available and Patient being monitored Patient Re-evaluated:Patient Re-evaluated prior to induction Oxygen Delivery Method: Circle system utilized Preoxygenation: Pre-oxygenation with 100% oxygen Induction Type: IV induction Ventilation: Mask ventilation without difficulty and Oral airway inserted - appropriate to patient size Laryngoscope Size: Mac and 3 Grade View: Grade II Tube type: Oral Tube size: 7.0 mm Number of attempts: 1 Airway Equipment and Method: Stylet Placement Confirmation: ETT inserted through vocal cords under direct vision,  positive ETCO2 and breath sounds checked- equal and bilateral Secured at: 21 cm Tube secured with: Tape Dental Injury: Teeth and Oropharynx as per pre-operative assessment

## 2018-02-03 NOTE — Op Note (Signed)
PRE-OPERATIVE DIAGNOSIS:  Uterine Fibroids, Menorrhagia, Anemia   POST-OPERATIVE DIAGNOSIS:  Uterine Fibroids ( specimen weight 522 gm), menorrhagia, anemia, extensive pelvic adhesions, left ovarian cyst   PROCEDURE:  TOTAL ABDOMINAL HYSTERECTOMY, BILATERAL SALPINGECTOMY, LEFT OVARIAN CYSTECTOMY, EXTENSIVE LYSIS OF ADHESIONS  SURGEON: Elveria Royals, MD  ASSISTANT:  Servando Salina, MD   ANESTHESIA:  General endotracheal  EBL:  700 cc  IVF: 2500 LR   Urine output: urine in foley 400 cc cleared towards the end of the case   BLOOD ADMINISTERED: none   DRAINS: Urinary Catheter (Foley)   LOCAL MEDICATIONS USED:  MARCAINE 0.25% 10 cc skin infiltration     SPECIMEN:   Uterus, cervix, both fallopian tubes, left ovarian cyst   DISPOSITION OF SPECIMEN:  PATHOLOGY  COUNTS:  YES  PATIENT DISPOSITION:  PACU - hemodynamically stable. Then admit for post-op care.   Delay start of Pharmacological VTE agent (>24hrs) due to surgical blood loss or risk of bleeding: yes  PROCEDURE:   Indication:  Symptomatic uterine fibroids with abdominal mass, pain and menorrhagia. Salpingectomy data on ovarian cancer risk reduction was reviewed and she agreed. Risks and complications of surgery including infection, bleeding, damage to internal organs and other including but not limited to surgery related problems including pneumonia, VTE reviewed. Informed written consent was obtained.   Patient was brought to the operating room with IV running. She received 3 gm Ancef due to BMI. Underwent general anesthesia without difficulty and was given dorsal supine position, prepped and draped in sterile fashion. Foley catheter was placed. Exam under anesthesia noted enlarged uterus with difficult mobility. Pfannenstiel incision was made with scalpel and carried down to the underlying fascia with Bovie with excellent hemostasis.  Fascia incised and extended laterally. Fascia grasped with Kocher's and underlying  rectus muscles were dissected down, which required time due to extensive adhesions to fascia and perforating vessels causing bleeding. Rectus muscles were separated in midline. Omentum was adherent all along the midline. A clear window identified and entered and then with careful dissection, peritoneum incised. Omental adhesions were excised with careful watch on bowel. Large fibroid uterus was palpated. Uterine mobility was difficult due to anterior adhesion to the bladder from prior C-sections and her body habitus.  So first round ligaments were evaluated, grasped with Babcock, suture transfixed and then cut. Anterior and posterior broad ligament dissected open to allow slight mobility. Uterine fundus grasped with two tenaculums and uterus was delivered up through the incision.  Anterior broad ligament was dissected and dense bladder adhesions were excised and bladder flap created and pushed down. Right salpingectomy performed after dissecting tube from ovarian adhesions, we used Ligasure for desiccation and cutting when possible.  Left ovary had a cyst and a paratubal cyst and tube was densely adherent to the ovary. So a window was created in posterior broad ligament and right fallopian tube and right utero-ovarian ligament was clamped with Heaney clamps x 2 and cut in between. Pedicle was suture transfixed. Left salpingectomy and left ovarian cystectomy were performed after hysterectomy was complete. Right ovary grasped and window created in the broad ligament and Ligasure used to desiccate and cut right utero-ovarian ligament. Posterior broad ligament was dissected bilaterally and uterine vessels were skeletonized. Bilateral uterine vessels were clamped with Heaney clamps and cut and transfixed with 0 Vicryl. Uterus was amputated above the cervix and passed off and cervical stump was grasped with tenaculum. Hemostasis was observed. Cervix was long and pelvis not adequate due to Android pelvis, prior C-sections  and body habitus, so a good amount of time was spent performing trachelectomy. Straight Heaney clamps applied bilaterally, Cardinal ligaments cut and transfixed with 0 Vicryl several times. Bilateral curved Heaney's applied to vaginal angles with Uterosacral ligament and cut and transfixed. Vaginal opening noted on the left, it was cut circumferentially while grasping vaginal walls to prevent retraction of vaginal mucosa. Vaginal angles were suture transfixed. Then vaginal cut edges were sutured circumferentially for hemostasis. Vaginal opening closed from one angle to the other with 0 Vicryl interlocking sutures with excellent hemostasis. Vaginal angle and uterosacral sutures were tied together on each side. Bleeding from bladder dissection from cervix adhesion was controlled with cautery.  Left tube and ovary grasped, Left ovarian cystectomy performed. A superficial incision made on ovarian capsule with knife and clear fluid drained. Then left ovarian cystectomy performed and hemostasis achieved. Cut edges of the ovary sutured together with 2-0 vicryl. Left salpingectomy performed with Ligasure. Bleeding noted from left broad ligament under uteroovarian pedicle, it was grasped with right angle clamp and suture transfixed. Vaginal cuff was checked again and was hemostatic. Both ureters were seen well and peristalsing.  All pedicles appeared dry after using to cauterize pinpoint bleeding. Arista was sprayed over the vaginal cuff and in cervical/bladder dissection and anterior bladder dissection area. Ovaries looked hemostatic and normal. Omental dissection completed from peritoneal adhesions to allow peritoneal closure. Left rectus perforation kept bleeding despite cauterizing, so 3 figure of 8 sutures placed to control bleeding. Peritoneal edges grasped and peritoneum closed with 2-0 Vicryl. Fascia sutured with 0 Vicryl from two ends and met in midline. Subcutaneous layer was deep and closed with 2-0 Plain gut.  Skin approximated with 4-0 Vicryl in subcuticular fashion.  Sterile dressing placed.  Sterile Honeycomb dressing and pressure dressing placed.  All  Instruments/ lap/ sponges counts were correct x2. No complications.  Dr Benjie Karvonen and Dr Garwin Brothers were co-surgeons for case.   --V.Caswell Alvillar MD

## 2018-02-03 NOTE — Transfer of Care (Signed)
Immediate Anesthesia Transfer of Care Note  Patient: CATERA HANKINS  Procedure(s) Performed: HYSTERECTOMY ABDOMINAL WITH BILATERAL SALPINGECTOMY, LEFT OVARIAN CYSTECTOMY AND EXTENSIVE LYSIS OF ADHESIONS (Bilateral Abdomen)  Patient Location: PACU  Anesthesia Type:General  Level of Consciousness: awake, alert  and sedated  Airway & Oxygen Therapy: Patient Spontanous Breathing and Patient connected to nasal cannula oxygen  Post-op Assessment: Report given to RN, Post -op Vital signs reviewed and stable and Patient moving all extremities  Post vital signs: Reviewed and stable  Last Vitals:  Vitals Value Taken Time  BP 134/70 02/03/2018  6:15 PM  Temp    Pulse 105 02/03/2018  6:20 PM  Resp 21 02/03/2018  6:20 PM  SpO2 95 % 02/03/2018  6:20 PM  Vitals shown include unvalidated device data.  Last Pain:  Vitals:   02/03/18 1145  TempSrc: Oral  PainSc: 0-No pain      Patients Stated Pain Goal: 3 (29/51/88 4166)  Complications: No apparent anesthesia complications

## 2018-02-03 NOTE — OR Nursing (Signed)
Called husband in waiting room to update per Dr. Benjie Karvonen

## 2018-02-03 NOTE — Anesthesia Preprocedure Evaluation (Signed)
Anesthesia Evaluation  Patient identified by MRN, date of birth, ID band Patient awake    Reviewed: Allergy & Precautions, H&P , NPO status , Patient's Chart, lab work & pertinent test results, reviewed documented beta blocker date and time   Airway Mallampati: II  TM Distance: >3 FB Neck ROM: full    Dental no notable dental hx.    Pulmonary neg pulmonary ROS,    Pulmonary exam normal breath sounds clear to auscultation       Cardiovascular Exercise Tolerance: Good Pt. on home beta blockers + dysrhythmias  Rhythm:regular Rate:Normal     Neuro/Psych  Headaches, negative psych ROS   GI/Hepatic negative GI ROS, Neg liver ROS,   Endo/Other  Hypothyroidism Hyperthyroidism   Renal/GU negative Renal ROS  negative genitourinary   Musculoskeletal   Abdominal   Peds  Hematology negative hematology ROS (+)   Anesthesia Other Findings   Reproductive/Obstetrics negative OB ROS                            Anesthesia Physical Anesthesia Plan  ASA: II  Anesthesia Plan: General   Post-op Pain Management:    Induction: Intravenous  PONV Risk Score and Plan: 3 and Ondansetron, Dexamethasone, Scopolamine patch - Pre-op and Midazolam  Airway Management Planned: Oral ETT and LMA  Additional Equipment:   Intra-op Plan:   Post-operative Plan: Extubation in OR  Informed Consent: I have reviewed the patients History and Physical, chart, labs and discussed the procedure including the risks, benefits and alternatives for the proposed anesthesia with the patient or authorized representative who has indicated his/her understanding and acceptance.   Dental Advisory Given  Plan Discussed with: CRNA, Anesthesiologist and Surgeon  Anesthesia Plan Comments: (  )       Anesthesia Quick Evaluation

## 2018-02-04 ENCOUNTER — Encounter (HOSPITAL_COMMUNITY): Payer: Self-pay | Admitting: Obstetrics & Gynecology

## 2018-02-04 LAB — BASIC METABOLIC PANEL
ANION GAP: 6 (ref 5–15)
BUN: 9 mg/dL (ref 6–20)
CALCIUM: 7.7 mg/dL — AB (ref 8.9–10.3)
CO2: 26 mmol/L (ref 22–32)
Chloride: 104 mmol/L (ref 98–111)
Creatinine, Ser: 0.76 mg/dL (ref 0.44–1.00)
Glucose, Bld: 141 mg/dL — ABNORMAL HIGH (ref 70–99)
Potassium: 4 mmol/L (ref 3.5–5.1)
Sodium: 136 mmol/L (ref 135–145)

## 2018-02-04 LAB — CBC WITH DIFFERENTIAL/PLATELET
BASOS ABS: 0 10*3/uL (ref 0.0–0.1)
BASOS PCT: 0 %
Eosinophils Absolute: 0 10*3/uL (ref 0.0–0.5)
Eosinophils Relative: 0 %
HCT: 34.2 % — ABNORMAL LOW (ref 36.0–46.0)
Hemoglobin: 11.2 g/dL — ABNORMAL LOW (ref 12.0–15.0)
Lymphocytes Relative: 13 %
Lymphs Abs: 1.3 10*3/uL (ref 0.7–4.0)
MCH: 31.4 pg (ref 26.0–34.0)
MCHC: 32.7 g/dL (ref 30.0–36.0)
MCV: 95.8 fL (ref 80.0–100.0)
Monocytes Absolute: 0.5 10*3/uL (ref 0.1–1.0)
Monocytes Relative: 5 %
NEUTROS ABS: 8.2 10*3/uL — AB (ref 1.7–7.7)
NRBC: 0 % (ref 0.0–0.2)
Neutrophils Relative %: 82 %
PLATELETS: 198 10*3/uL (ref 150–400)
RBC: 3.57 MIL/uL — AB (ref 3.87–5.11)
RDW: 13.6 % (ref 11.5–15.5)
WBC: 10 10*3/uL (ref 4.0–10.5)

## 2018-02-04 MED ORDER — IBUPROFEN 600 MG PO TABS
600.0000 mg | ORAL_TABLET | Freq: Four times a day (QID) | ORAL | Status: DC | PRN
  Administered 2018-02-04 – 2018-02-05 (×3): 600 mg via ORAL
  Filled 2018-02-04 (×3): qty 1

## 2018-02-04 MED ORDER — OXYCODONE-ACETAMINOPHEN 5-325 MG PO TABS
1.0000 | ORAL_TABLET | ORAL | Status: DC | PRN
  Administered 2018-02-04 – 2018-02-05 (×4): 1 via ORAL
  Filled 2018-02-04 (×4): qty 1

## 2018-02-04 MED ORDER — OXYCODONE-ACETAMINOPHEN 5-325 MG PO TABS
2.0000 | ORAL_TABLET | ORAL | Status: DC | PRN
  Administered 2018-02-04 – 2018-02-05 (×3): 2 via ORAL
  Filled 2018-02-04 (×3): qty 2

## 2018-02-04 NOTE — Addendum Note (Signed)
Addendum  created 02/04/18 1309 by Flossie Dibble, CRNA   Charge Capture section accepted

## 2018-02-04 NOTE — Plan of Care (Signed)

## 2018-02-04 NOTE — Anesthesia Postprocedure Evaluation (Signed)
Anesthesia Post Note  Patient: Carmen Henderson  Procedure(s) Performed: HYSTERECTOMY ABDOMINAL WITH BILATERAL SALPINGECTOMY, LEFT OVARIAN CYSTECTOMY AND EXTENSIVE LYSIS OF ADHESIONS (Bilateral Abdomen)     Patient location during evaluation: PACU Anesthesia Type: General Level of consciousness: awake and alert Pain management: pain level controlled Vital Signs Assessment: post-procedure vital signs reviewed and stable Respiratory status: spontaneous breathing, nonlabored ventilation, respiratory function stable and patient connected to nasal cannula oxygen Cardiovascular status: blood pressure returned to baseline and stable Postop Assessment: no apparent nausea or vomiting Anesthetic complications: no    Last Vitals:  Vitals:   02/04/18 0449 02/04/18 0849  BP: 103/66 102/68  Pulse: 88 79  Resp: 16 20  Temp: 36.8 C 36.7 C  SpO2: 99% 100%    Last Pain:  Vitals:   02/04/18 0849  TempSrc: Oral  PainSc:    Pain Goal: Patients Stated Pain Goal: 3 (02/04/18 0835)               Eshan Trupiano

## 2018-02-04 NOTE — Progress Notes (Signed)
1 Day Post-Op Procedure(s) (LRB): HYSTERECTOMY ABDOMINAL WITH BILATERAL SALPINGECTOMY, LEFT OVARIAN CYSTECTOMY AND EXTENSIVE LYSIS OF ADHESIONS (Bilateral)  Subjective: Patient reports tolerating PO and no problems voiding.  Pain well controlled. No HA/ SOB. Slight dizziness when first gets up and then ok.  Objective: I have reviewed patient's vital signs, intake and output, medications and labs.  General: alert and cooperative Resp: clear to auscultation bilaterally Cardio: regular rate and rhythm, S1, S2 normal, no murmur, click, rub or gallop GI: soft, non-tender; bowel sounds normal; no masses,  no organomegaly Extremities: extremities normal, atraumatic, no cyanosis or edema Incision CDI with dressing   Assessment: s/p Procedure(s) with comments: HYSTERECTOMY ABDOMINAL WITH BILATERAL SALPINGECTOMY, LEFT OVARIAN CYSTECTOMY AND EXTENSIVE LYSIS OF ADHESIONS (Bilateral) - 2 1/2 hrs.: stable, progressing well, tolerating diet and PO pain meds  Plan: Advance diet Encourage ambulation watch progress, may consider D/C home tomorrow   LOS: 1 day    Carmen Henderson 02/04/2018, 9:00 PM

## 2018-02-05 MED ORDER — OXYCODONE-ACETAMINOPHEN 5-325 MG PO TABS: 1.0000 | ORAL_TABLET | ORAL | 0 refills | Status: AC | PRN

## 2018-02-05 MED ORDER — ONDANSETRON HCL 4 MG PO TABS: 4.0000 mg | ORAL_TABLET | Freq: Four times a day (QID) | ORAL | 0 refills | Status: DC | PRN

## 2018-02-05 MED ORDER — FERROUS SULFATE 325 (65 FE) MG PO TABS: 325.0000 mg | ORAL_TABLET | Freq: Every day | ORAL | 3 refills | Status: DC

## 2018-02-05 MED ORDER — IBUPROFEN 600 MG PO TABS: 600.0000 mg | ORAL_TABLET | Freq: Four times a day (QID) | ORAL | 0 refills | Status: DC | PRN

## 2018-02-05 NOTE — Discharge Instructions (Addendum)
Hysterectomy Information A hysterectomy is a surgery to remove your uterus. After surgery, you will no longer have periods. Also, you will not be able to get pregnant. Reasons for this surgery  You have bleeding that is not normal and keeps coming back.  You have lasting (chronic) lower belly (pelvic) pain.  You have a lasting infection.  The lining of your uterus grows outside your uterus.  The lining of your uterus grows in the muscle of your uterus.  Your uterus falls down into your vagina.  You have a growth in your uterus that causes problems.  You have cells that could turn into cancer (precancerous cells).  You have cancer of the uterus or cervix. Types There are 3 types of hysterectomies. Depending on the type, the surgery will:  Remove the top part of the uterus only.  Remove the uterus and the cervix.  Remove the uterus, cervix, and tissue that holds the uterus in place in the lower belly.  Ways a hysterectomy can be performed There are 5 ways this surgery can be performed.  A cut (incision) is made in the belly (abdomen). The uterus is taken out through the cut.  A cut is made in the vagina. The uterus is taken out through the cut.  Three or four cuts are made in the belly. A surgical device with a camera is put through one of the cuts. The uterus is cut into small pieces. The uterus is taken out through the cuts or the vagina.  Three or four cuts are made in the belly. A surgical device with a camera is put through one of the cuts. The uterus is taken out through the vagina.  Three or four cuts are made in the belly. A surgical device that is controlled by a computer makes a visual image. The device helps the surgeon control the surgical tools. The uterus is cut into small pieces. The pieces are taken out through the cuts or through the vagina.  What can I expect after the surgery?  You will be given pain medicine.  You will need help at home for 3-5 days  after surgery.  You will need to see your doctor in 2-4 weeks after surgery.  You may get hot flashes, have night sweats, and have trouble sleeping.  You may need to have Pap tests in the future if your surgery was related to cancer. Talk to your doctor. It is still good to have regular exams. This information is not intended to replace advice given to you by your health care provider. Make sure you discuss any questions you have with your health care provider. Document Released: 06/01/2011 Document Revised: 08/15/2015 Document Reviewed: 11/14/2012 Elsevier Interactive Patient Education  [employer].

## 2018-02-05 NOTE — Progress Notes (Signed)
2 Days Post-Op Procedure(s) (LRB): HYSTERECTOMY ABDOMINAL WITH BILATERAL SALPINGECTOMY, LEFT OVARIAN CYSTECTOMY AND EXTENSIVE LYSIS OF ADHESIONS (Bilateral)  Subjective: Pain worse today since missed taking her meds at 6hr interval since she was sleeping.  Also c/o gas pain, no n/v and tolerating reg diet.  Voiding well. No SOB/ CP/ dizziness  Objective: I have reviewed patient's vital signs, intake and output, medications and labs. Patient Vitals for the past 24 hrs:  BP Temp Temp src Pulse Resp SpO2  02/05/18 0827 122/68 - - 89 - -  02/05/18 0806 (!) 87/58 98.9 F (37.2 C) Oral 95 18 96 %  02/05/18 0416 122/80 98.5 F (36.9 C) - 86 17 100 %  02/04/18 2339 100/65 99.1 F (37.3 C) Oral 93 17 96 %  02/04/18 2018 110/69 98.4 F (36.9 C) Oral 94 18 100 %  02/04/18 1603 97/68 98.4 F (36.9 C) Oral 88 20 98 %  02/04/18 1223 104/65 98.9 F (37.2 C) Oral 92 20 98 %   General: alert and cooperative Resp: clear to auscultation bilaterally Cardio: regular rate and rhythm, S1, S2 normal, no murmur, click, rub or gallop GI: soft, non-tender; bowel sounds normal; no masses,  no organomegaly Extremities: extremities normal, atraumatic, no cyanosis or edema and Homans sign is negative, no sign of DVT Vaginal Bleeding: none  Assessment: s/p Procedure(s) with comments: HYSTERECTOMY ABDOMINAL WITH BILATERAL SALPINGECTOMY, LEFT OVARIAN CYSTECTOMY AND EXTENSIVE LYSIS OF ADHESIONS (Bilateral) - 2 1/2 hrs.: stable, progressing well, tolerating diet and pain well controlled   Plan: Discharge home after able to pass flatus/  Post-op care and warning s/s reviewed F/up office 2 weeks    LOS: 2 days    Carmen Henderson 02/05/2018, 9:23 AM

## 2018-02-05 NOTE — Discharge Summary (Signed)
Physician Discharge Summary  Patient ID: Carmen Henderson MRN: [id number] DOB/AGE: 23-Apr-1970 47 y.o.  Admit date: 02/03/2018 Discharge date: 02/05/2018  Admission Diagnoses: Symptomatic uterine fibroids  Discharge Diagnoses:  Active Problems:   Leiomyoma of uterus   S/P TAH (total abdominal hysterectomy) bilateral salpingectomy and left ovarian cystectomy and extensive lysis of adhesions   Discharged Condition: good  Hospital Course: Uncomplicated. Post op pain well controlled, vitals, I/O, labs nl, ambulating, voiding, eating regular diet, no vag bleeding   Discharge Exam: Blood pressure 122/68, pulse 89, temperature 98.9 F (37.2 C), temperature source Oral, resp. rate 18, height 4\' 10"  (1.473 m), weight 118.6 kg, SpO2 96 %. Physical exam:  A&O x 3, no acute distress. Pleasant HEENT neg, no thyromegaly Lungs CTA bilat CV RRR, S1S2 normal Abdo soft, non tender, non acute, obese, incision CDI, active BS Extr no edema/ tenderness Pelvic no bleeding   Disposition: Discharge disposition: 01-Home or Self Care       Discharge Instructions     Remove dressing in 72 hours   Complete by:  As directed    Call MD for:   Complete by:  As directed    Vaginal bleeding   Call MD for:  difficulty breathing, headache or visual disturbances   Complete by:  As directed    Call MD for:  extreme fatigue   Complete by:  As directed    Call MD for:  hives   Complete by:  As directed    Call MD for:  persistant dizziness or light-headedness   Complete by:  As directed    Call MD for:  persistant nausea and vomiting   Complete by:  As directed    Call MD for:  redness, tenderness, or signs of infection (pain, swelling, redness, odor or green/yellow discharge around incision site)   Complete by:  As directed    Call MD for:  severe uncontrolled pain   Complete by:  As directed    Call MD for:  temperature >100.4   Complete by:  As directed    Diet - low sodium heart healthy    Complete by:  As directed    Driving Restrictions   Complete by:  As directed    4 weeks   Increase activity slowly   Complete by:  As directed    Lifting restrictions   Complete by:  As directed    10 lbs for 6 weeks   Nursing communication   Complete by:  As directed    Change honeycomb dressing   Sexual Activity Restrictions   Complete by:  As directed    6 weeks     Allergies as of 02/05/2018   No Known Allergies     Medication List    TAKE these medications   atenolol 25 MG tablet Commonly known as:  TENORMIN Take 1 tablet (25 mg total) by mouth daily. What changed:    how much to take  when to take this   EXCEDRIN MIGRAINE 250-250-65 MG tablet Generic drug:  aspirin-acetaminophen-caffeine Take 2 tablets by mouth every 6 (six) hours as needed for headache.   ferrous sulfate 325 (65 FE) MG EC tablet Take 325 mg by mouth every morning. What changed:  Another medication with the same name was added. Make sure you understand how and when to take each.   ferrous sulfate 325 (65 FE) MG tablet Take 1 tablet (325 mg total) by mouth daily with breakfast. Start taking on:  02/06/2018 What  changed:  You were already taking a medication with the same name, and this prescription was added. Make sure you understand how and when to take each.   ibuprofen 600 MG tablet Commonly known as:  ADVIL,MOTRIN Take 1 tablet (600 mg total) by mouth every 6 (six) hours as needed for headache, mild pain or moderate pain.   methimazole 5 MG tablet Commonly known as:  TAPAZOLE Take 0.5 tablets (2.5 mg total) by mouth daily. With meals. What changed:  additional instructions   multivitamin with minerals Tabs tablet Take 1 tablet by mouth daily.   nortriptyline 10 MG capsule Commonly known as:  PAMELOR Take 30 mg by mouth See admin instructions. Take 30 mg by mouth at bedtime for 5 days prior to menstrual cycle   ondansetron 4 MG tablet Commonly known as:  ZOFRAN Take 1 tablet  (4 mg total) by mouth every 6 (six) hours as needed for nausea.   oxyCODONE-acetaminophen 5-325 MG tablet Commonly known as:  PERCOCET/ROXICET Take 1 tablet by mouth every 4 (four) hours as needed for up to 7 days for moderate pain (pain scale below 7/10).   TROKENDI XR 50 MG Cp24 Generic drug:  Topiramate ER Take 50 mg by mouth at bedtime.   zolmitriptan 2.5 MG disintegrating tablet Commonly known as:  ZOMIG-ZMT Take 2.5 mg by mouth as needed for migraine.      Follow-up Information    Azucena Fallen, MD Follow up in 2 week(s).   Specialty:  Obstetrics and Gynecology Contact information: [address] [place] [address] [phone]           Signed: Elveria Royals 02/05/2018, 10:06 AM

## 2018-02-05 NOTE — Progress Notes (Signed)
Discharge instructions and prescriptions given to pt.  Discussed post-op care, signs and symptoms to report to the MD, upcoming appointments, and meds.  Told pt to call MD if she does not pass gas within 3 days per Dr. Benjie Karvonen.  Pt verbalizes understanding and has no questions or concerns at this time. Pt discharged from hospital in stable condition.

## 2018-02-05 NOTE — Progress Notes (Signed)
Provided prune and apple juice twice and pt has been up to ambulate in the hall x2. Pt still has no passed gas.  Dr. Benjie Karvonen notified. Was told it was OK for pt to be discharged home.

## 2018-02-06 LAB — TYPE AND SCREEN
ABO/RH(D): O POS
Antibody Screen: POSITIVE
Donor AG Type: NEGATIVE
Donor AG Type: NEGATIVE
UNIT DIVISION: 0
Unit division: 0

## 2018-02-06 LAB — BPAM RBC
BLOOD PRODUCT EXPIRATION DATE: 201912152359
Blood Product Expiration Date: 201912132359
UNIT TYPE AND RH: 5100
UNIT TYPE AND RH: 5100

## 2018-03-08 ENCOUNTER — Other Ambulatory Visit (INDEPENDENT_AMBULATORY_CARE_PROVIDER_SITE_OTHER): Payer: No Typology Code available for payment source

## 2018-03-08 ENCOUNTER — Other Ambulatory Visit: Payer: No Typology Code available for payment source

## 2018-03-08 DIAGNOSIS — E0500 Thyrotoxicosis with diffuse goiter without thyrotoxic crisis or storm: Secondary | ICD-10-CM

## 2018-03-08 LAB — T4, FREE: Free T4: 0.65 ng/dL (ref 0.60–1.60)

## 2018-03-08 LAB — T3, FREE: T3, Free: 3.6 pg/mL (ref 2.3–4.2)

## 2018-03-08 LAB — TSH: TSH: 1.33 u[IU]/mL (ref 0.35–4.50)

## 2018-03-25 ENCOUNTER — Ambulatory Visit: Payer: No Typology Code available for payment source | Admitting: Internal Medicine

## 2018-03-28 ENCOUNTER — Ambulatory Visit: Payer: No Typology Code available for payment source | Admitting: Internal Medicine

## 2018-03-28 NOTE — Progress Notes (Deleted)
Patient ID: Carmen Henderson, female   DOB: 09/06/1970, 48 y.o.   MRN: [id number]    HPI  Carmen Henderson is a 48 y.o.-year-old female, initially referred by her PCP, Dr. Charlton Amor, returning for follow-up for Graves' disease.  Last visit 3 months ago.  She had anemia due to fibroids with an Hb of 7.9 in 10/2017.  She was given a blood transfusion, and then iron infusion, and Megace. She had TAH and BSO in 01/2018  For her Graves' disease, we started methimazole 5 mg twice a day and atenolol 25 mg daily but were able to decrease the dose of methimazole to 5 mg once a day in 03/2017.  We had to decrease the dose of atenolol because of shortness of breath, however, she did develop tachycardia so I advised her to increase the dose back to 25 mg daily in 12/2017.  At last visit, in 11/2017, we decreased the dose of methimazole to 2.5 mg daily.  Subsequent TFTs were normal less than a month ago.  Reviewed patient's TFTs: Lab Results  Component Value Date   TSH 1.33 03/08/2018   TSH 1.88 12/20/2017   TSH 4.33 11/11/2017   TSH 1.755 11/05/2017   TSH 1.87 06/14/2017   TSH 4.18 04/01/2017   TSH 0.10 (L) 02/17/2017   TSH 0.03 (L) 01/14/2017   FREET4 0.65 03/08/2018   FREET4 0.66 12/20/2017   FREET4 0.68 11/11/2017   FREET4 0.65 06/14/2017   FREET4 0.55 (L) 04/01/2017   FREET4 0.68 02/17/2017   FREET4 0.92 01/14/2017  12/25/2016: TSH 0.188 (0.45-5.33), free T4 0.8 (0.6-1.4), free T3 4.51 (2.3-4.2), TPO Abs 834.2, ATA <1.8 12/04/2015: TSH 2.129  Her TSI's were elevated but they decreased at last check: Lab Results  Component Value Date   TSI 119 12/20/2017   TSI 446 (H) 02/17/2017   She does have fatigue, no wt loss, heat intolerance, palpitations, anxiety, hyper defecation, tremors.  Pt denies: - feeling nodules in neck - hoarseness - dysphagia - choking - SOB with lying down  Pt does have a FH of thyroid ds.: daughter and mother. No FH of thyroid cancer. No h/o radiation  tx to head or neck.  No seaweed or kelp. No recent contrast studies. No herbal supplements. No Biotin use. No recent steroids use.   Pt. also has a history of migraines.  ROS: Constitutional: no weight gain/no weight loss, no fatigue, no subjective hyperthermia, no subjective hypothermia Eyes: no blurry vision, no xerophthalmia ENT: no sore throat, + see HPI Cardiovascular: no CP/no SOB/no palpitations/no leg swelling Respiratory: no cough/no SOB/no wheezing Gastrointestinal: no N/no V/no D/no C/no acid reflux Musculoskeletal: no muscle aches/no joint aches Skin: no rashes, no hair loss Neurological: no tremors/no numbness/no tingling/no dizziness  I reviewed pt's medications, allergies, PMH, social hx, family hx, and changes were documented in the history of present illness. Otherwise, unchanged from my initial visit note.  Past Medical History:  Diagnosis Date  . Anemia   . Dysrhythmia    tachycardia  . Fibroids   . Graves disease   . Headache   . History of blood transfusion 10/2017   [place] 2 units  . Hypothyroidism   . Migraine   . Seasonal allergies    Past Surgical History:  Procedure Laterality Date  . CARPAL TUNNEL RELEASE Right   . CARPAL TUNNEL RELEASE Left   . CESAREAN SECTION     x2  . FOOT SURGERY Left   . HYSTERECTOMY ABDOMINAL WITH SALPINGECTOMY  Bilateral 02/03/2018   Procedure: HYSTERECTOMY ABDOMINAL WITH BILATERAL SALPINGECTOMY, LEFT OVARIAN CYSTECTOMY AND EXTENSIVE LYSIS OF ADHESIONS;  Surgeon: Azucena Fallen, MD;  Location: [facility];  Service: Gynecology;  Laterality: Bilateral;  2 1/2 hrs.  Everlean Alstrom GENERIC HISTORICAL  05/29/2014   IR RADIOLOGIST EVAL & MGMT 05/29/2014 Sandi Mariscal, MD [facility] INTERV RAD - uterine embolization   . KNEE ARTHROPLASTY Right   . UPPER GI ENDOSCOPY     Social History   Social History  . Marital status: Single    Spouse name: N/A  . Number of children: 2   Occupational History  . Food and Lawyer   Social History Main  Topics  . Smoking status: Never Smoker  . Smokeless tobacco: Never Used  . Alcohol use No  . Drug use: No   Current Outpatient Medications on File Prior to Visit  Medication Sig Dispense Refill  . aspirin-acetaminophen-caffeine (EXCEDRIN MIGRAINE) 250-250-65 MG per tablet Take 2 tablets by mouth every 6 (six) hours as needed for headache.    Marland Kitchen atenolol (TENORMIN) 25 MG tablet Take 1 tablet (25 mg total) by mouth daily. (Patient taking differently: Take 12.5 mg by mouth at bedtime. ) 90 tablet 3  . ferrous sulfate 325 (65 FE) MG EC tablet Take 325 mg by mouth every morning.     . ferrous sulfate 325 (65 FE) MG tablet Take 1 tablet (325 mg total) by mouth daily with breakfast.  3  . ibuprofen (ADVIL,MOTRIN) 600 MG tablet Take 1 tablet (600 mg total) by mouth every 6 (six) hours as needed for headache, mild pain or moderate pain. 30 tablet 0  . methimazole (TAPAZOLE) 5 MG tablet Take 0.5 tablets (2.5 mg total) by mouth daily. With meals. (Patient taking differently: Take 2.5 mg by mouth daily. ) 60 tablet 2  . Multiple Vitamin (MULTIVITAMIN WITH MINERALS) TABS tablet Take 1 tablet by mouth daily.    . nortriptyline (PAMELOR) 10 MG capsule Take 30 mg by mouth See admin instructions. Take 30 mg by mouth at bedtime for 5 days prior to menstrual cycle    . ondansetron (ZOFRAN) 4 MG tablet Take 1 tablet (4 mg total) by mouth every 6 (six) hours as needed for nausea. 20 tablet 0  . TROKENDI XR 50 MG CP24 Take 50 mg by mouth at bedtime.     Marland Kitchen zolmitriptan (ZOMIG-ZMT) 2.5 MG disintegrating tablet Take 2.5 mg by mouth as needed for migraine.      No current facility-administered medications on file prior to visit.    No Known Allergies  FH: DM in sisters and mother HTN in mother and sister Heart ds in mother Br CA in mother, leukemia in father + see HPI  PE: There were no vitals taken for this visit. Wt Readings from Last 3 Encounters:  02/04/18 261 lb 7.5 oz (118.6 kg)  01/21/18 261 lb 8 oz  (118.6 kg)  12/20/17 261 lb (118.4 kg)   Constitutional: overweight, in NAD Eyes: PERRLA, EOMI, no exophthalmos, no lig lag, no stare ENT: moist mucous membranes, no thyromegaly, no cervical lymphadenopathy Cardiovascular: RRR, No MRG Respiratory: CTA B Gastrointestinal: abdomen soft, NT, ND, BS+ Musculoskeletal: no deformities, strength intact in all 4 Skin: moist, warm, no rashes Neurological: no tremor with outstretched hands, DTR normal in all 4  ASSESSMENT: 1.  Graves' disease  PLAN:  1.  Patient with history of mildly low TSH with high free T3, without thyrotoxic symptoms: No weight loss, heat intolerance, hyper defecation, palpitations,  anxiety, but with a rapid heart rate.  However, she also had severe anemia due to uterine bleeding from fibroids and her heart rate may have been related to this. -We started methimazole and were able to reduce the dose to 2.5 mg daily in 11/2017.  She had labs TFTs checked last month and these were normal.  We continued the same dose of methimazole. -We also checked her TSI at last visit and they have normalized.  I explained that this is a good sign that the Graves' disease is quieting down. -Since last visit we had to increase atenolol due to her palpitations but I am hoping we can reduce it soon now that she had TAH and hopefully her chronic blood loss anemia is improving -At this visit, we again discussed about possible modalities of treatment for her Graves' disease, to include continuing methimazole use, RAI ablation and last resort surgery.  However, she is responding very well to methimazole so I suggested to continue this for now.  Also, she is on a low dose of methimazole which was shown to be safe and effective in preventing Graves' disease recurrences in the long-term. -We discussed that if her TSH starts increasing we will need to stop methimazole completely and continue to follow her TFTs.  I plan to repeat her TFTs in 2 months. -I will  see her back in 6 months  Philemon Kingdom, MD PhD [facility] Endocrinology

## 2018-06-24 DIAGNOSIS — I10 Essential (primary) hypertension: Secondary | ICD-10-CM | POA: Insufficient documentation

## 2018-10-03 ENCOUNTER — Other Ambulatory Visit: Payer: Self-pay

## 2018-10-03 ENCOUNTER — Telehealth: Payer: Self-pay | Admitting: Internal Medicine

## 2018-10-03 MED ORDER — METHIMAZOLE 5 MG PO TABS: 2.5000 mg | ORAL_TABLET | Freq: Every day | ORAL | 1 refills | Status: DC

## 2018-10-03 NOTE — Telephone Encounter (Signed)
[facility] called to confirm dosing instructions for Methoimazole 2.5 mg, Oral, Daily, With meals.  How many meals a day?

## 2018-10-03 NOTE — Telephone Encounter (Signed)
RX fixed and sent again.

## 2018-11-07 ENCOUNTER — Telehealth: Payer: Self-pay | Admitting: Internal Medicine

## 2018-11-07 NOTE — Telephone Encounter (Signed)
ATC to schedule    Appointment Request From: [name]  With Provider: Philemon Kingdom, MD [facility] Endocrinology]  Preferred Date Range: 11/21/2018 - 11/25/2018  Preferred Times: Any Time  Reason for visit: Request an Appointment  Comments: Annual check up for graves and yall about my meds and symptoms of graves. I also need new labs drawn before my appointment.

## 2018-11-09 ENCOUNTER — Other Ambulatory Visit: Payer: Self-pay

## 2018-11-09 NOTE — Telephone Encounter (Signed)
Appointment scheduled 11/11/18

## 2018-11-09 NOTE — Telephone Encounter (Signed)
ATC to schedule x 2 °

## 2018-11-11 ENCOUNTER — Ambulatory Visit (INDEPENDENT_AMBULATORY_CARE_PROVIDER_SITE_OTHER): Payer: No Typology Code available for payment source | Admitting: Internal Medicine

## 2018-11-11 ENCOUNTER — Other Ambulatory Visit: Payer: Self-pay

## 2018-11-11 ENCOUNTER — Encounter: Payer: Self-pay | Admitting: Internal Medicine

## 2018-11-11 VITALS — BP 130/80 | HR 86 | Ht 59.65 in | Wt 249.0 lb

## 2018-11-11 DIAGNOSIS — E0500 Thyrotoxicosis with diffuse goiter without thyrotoxic crisis or storm: Secondary | ICD-10-CM

## 2018-11-11 LAB — T4, FREE: Free T4: 0.63 ng/dL (ref 0.60–1.60)

## 2018-11-11 LAB — T3, FREE: T3, Free: 3 pg/mL (ref 2.3–4.2)

## 2018-11-11 LAB — TSH: TSH: 1.87 u[IU]/mL (ref 0.35–4.50)

## 2018-11-11 NOTE — Patient Instructions (Signed)
Please continue Methimazole 2.5 mg daily.  If the tests are normal, we may be able to stop this. Also, if the tests are normal, stop Atenolol.  Please stop at the lab.  Please come back for a follow-up appointment in 6 months.

## 2018-11-11 NOTE — Progress Notes (Signed)
Patient ID: Carmen Henderson, female   DOB: 03-04-1971, 48 y.o.   MRN: [id number]    HPI  Carmen Henderson is a 48 y.o.-year-old female, initially referred by her PCP, Dr. Charlton Amor, returning for follow-up for Graves' disease.  Last visit 1 year ago.  Before last visit, she had increased vaginal bleeding 2/2 fibroids >> hospitalized in 10/2017. She had a Hb 7.9 then and she was in shock. Given 2 units of blood and started on iron and Megace - now prn.  She had TAH in 01/2018.   We added methimazole 5 mg twice a day and atenolol 25 mg daily at last visit.  We decreased the dose to methimazole 5 mg once a day in 03/2017.  Afterwards, in 11/2017, we could decrease the dose to 2.5 mg daily.  She continues on this dose now.  Her TFTs were normal 8 months ago and we continued the same dose of methimazole.  Reviewed patient's TFTs: Lab Results  Component Value Date   TSH 1.33 03/08/2018   TSH 1.88 12/20/2017   TSH 4.33 11/11/2017   TSH 1.755 11/05/2017   TSH 1.87 06/14/2017   TSH 4.18 04/01/2017   TSH 0.10 (L) 02/17/2017   TSH 0.03 (L) 01/14/2017   FREET4 0.65 03/08/2018   FREET4 0.66 12/20/2017   FREET4 0.68 11/11/2017   FREET4 0.65 06/14/2017   FREET4 0.55 (L) 04/01/2017   FREET4 0.68 02/17/2017   FREET4 0.92 01/14/2017  12/25/2016: TSH 0.188 (0.45-5.33), free T4 0.8 (0.6-1.4), free T3 4.51 (2.3-4.2), TPO Abs 834.2, ATA <1.8 12/04/2015: TSH 2.129  Her TSI antibodies were elevated but normalized at last visit: Lab Results  Component Value Date   TSI 119 12/20/2017   TSI 446 (H) 02/17/2017   Pt denies: - Unintentional  weight loss, but lost 12 pounds intentionally since last visit - heat intolerance - tremors - palpitations - anxiety - hyperdefecation - hair loss She continues to have occasional fatigue.  Pt denies: - feeling nodules in neck - hoarseness - dysphagia - choking - SOB with lying down  Pt does have a FH of thyroid ds.: daughter and mother. No FH of  thyroid cancer. No h/o radiation tx to head or neck.  No seaweed or kelp. No recent contrast studies. No herbal supplements. No Biotin use now, she was on this in the past. No recent steroids use.   She has a history of migraines.  She had a slightly higher HbA1c at last check: 6.2% (06/2018). This will be repeated by PCP. Since then she lost 12 lbs.  ROS: Constitutional: no weight gain/+ weight loss, + fatigue, no subjective hyperthermia, no subjective hypothermia Eyes: no blurry vision, no xerophthalmia ENT: no sore throat, + see HPI Cardiovascular: no CP/no SOB/no palpitations/no leg swelling Respiratory: no cough/no SOB/no wheezing Gastrointestinal: no N/no V/no D/no C/no acid reflux Musculoskeletal: no muscle aches/+ joint aches (sprain left ankle 07/2018)  Skin: no rashes, no hair loss Neurological: no tremors/no numbness/no tingling/no dizziness  I reviewed pt's medications, allergies, PMH, social hx, family hx, and changes were documented in the history of present illness. Otherwise, unchanged from my initial visit note.  Past Medical History:  Diagnosis Date  . Anemia   . Dysrhythmia    tachycardia  . Fibroids   . Graves disease   . Headache   . History of blood transfusion 10/2017   [place] 2 units  . Hypothyroidism   . Migraine   . Seasonal allergies  Past Surgical History:  Procedure Laterality Date  . CARPAL TUNNEL RELEASE Right   . CARPAL TUNNEL RELEASE Left   . CESAREAN SECTION     x2  . FOOT SURGERY Left   . HYSTERECTOMY ABDOMINAL WITH SALPINGECTOMY Bilateral 02/03/2018   Procedure: HYSTERECTOMY ABDOMINAL WITH BILATERAL SALPINGECTOMY, LEFT OVARIAN CYSTECTOMY AND EXTENSIVE LYSIS OF ADHESIONS;  Surgeon: Azucena Fallen, MD;  Location: [facility];  Service: Gynecology;  Laterality: Bilateral;  2 1/2 hrs.  Everlean Alstrom GENERIC HISTORICAL  05/29/2014   IR RADIOLOGIST EVAL & MGMT 05/29/2014 Sandi Mariscal, MD [facility] INTERV RAD - uterine embolization   . KNEE ARTHROPLASTY Right   .  UPPER GI ENDOSCOPY     Social History   Social History  . Marital status: Single    Spouse name: N/A  . Number of children: 2   Occupational History  . Food and Lawyer   Social History Main Topics  . Smoking status: Never Smoker  . Smokeless tobacco: Never Used  . Alcohol use No  . Drug use: No   Current Outpatient Medications on File Prior to Visit  Medication Sig Dispense Refill  . aspirin-acetaminophen-caffeine (EXCEDRIN MIGRAINE) 250-250-65 MG per tablet Take 2 tablets by mouth every 6 (six) hours as needed for headache.    Marland Kitchen atenolol (TENORMIN) 25 MG tablet Take 1 tablet (25 mg total) by mouth daily. (Patient taking differently: Take 12.5 mg by mouth at bedtime. ) 90 tablet 3  . ferrous sulfate 325 (65 FE) MG EC tablet Take 325 mg by mouth every morning.     . ferrous sulfate 325 (65 FE) MG tablet Take 1 tablet (325 mg total) by mouth daily with breakfast.  3  . ibuprofen (ADVIL,MOTRIN) 600 MG tablet Take 1 tablet (600 mg total) by mouth every 6 (six) hours as needed for headache, mild pain or moderate pain. 30 tablet 0  . methimazole (TAPAZOLE) 5 MG tablet Take 0.5 tablets (2.5 mg total) by mouth daily. MUST HAVE APPOINTMENT FOR FURTHER REFILLS. 60 tablet 1  . Multiple Vitamin (MULTIVITAMIN WITH MINERALS) TABS tablet Take 1 tablet by mouth daily.    . nortriptyline (PAMELOR) 10 MG capsule Take 30 mg by mouth See admin instructions. Take 30 mg by mouth at bedtime for 5 days prior to menstrual cycle    . ondansetron (ZOFRAN) 4 MG tablet Take 1 tablet (4 mg total) by mouth every 6 (six) hours as needed for nausea. 20 tablet 0  . TROKENDI XR 50 MG CP24 Take 50 mg by mouth at bedtime.     Marland Kitchen zolmitriptan (ZOMIG-ZMT) 2.5 MG disintegrating tablet Take 2.5 mg by mouth as needed for migraine.      No current facility-administered medications on file prior to visit.    No Known Allergies  FH: DM in sisters and mother HTN in mother and sister Heart ds in mother Br CA in  mother, leukemia in father + see HPI  PE: BP 130/80   Pulse 86   Ht 4' 11.65" (1.515 m) Comment: measured without shoes  Wt 249 lb (112.9 kg)   SpO2 98%   BMI 49.21 kg/m  Wt Readings from Last 3 Encounters:  11/11/18 249 lb (112.9 kg)  02/04/18 261 lb 7.5 oz (118.6 kg)  01/21/18 261 lb 8 oz (118.6 kg)   Constitutional: overweight, in NAD Eyes: PERRLA, EOMI, no exophthalmos ENT: moist mucous membranes, no thyromegaly, no cervical lymphadenopathy Cardiovascular: RRR, No MRG Respiratory: CTA B Gastrointestinal: abdomen soft, NT, ND, BS+ Musculoskeletal:  no deformities, strength intact in all 4 Skin: moist, warm, no rashes Neurological: no tremor with outstretched hands, DTR normal in all 4  ASSESSMENT: 1.  Graves' disease  PLAN:  1.  Patient with history of mildly low TSH and high free T3, without thyrotoxic symptoms: No weight loss, heat intolerance, hyper defecation, palpitations, anxiety, but with a rapid heart rate.  At last visit, this could have been related to chronic blood loss and anemia.  Her TSI antibodies were elevated in the past giving her a diagnosis of Graves' disease.  At last check, however, the TSI antibodies decreased to normal. -We initially started her on methimazole 5 mg twice a day and atenolol 25 mg daily but we were able to decrease the methimazole dose in 03/2017 to 5 mg once a day and in 11/2017 to 2.5 mg once a day.  She continues on this dose now.  She is tolerating it well, without side effects. -We reviewed together her most recent labs from 02/2018 and they were normal.  We continued the same dose of methimazole -We again discussed about possible modalities of treatment for thyrotoxicosis, including the new methimazole, RAI ablation, or, last resort, surgery.  States she is responding well to methimazole, we can continue with this for now and I am hoping that we can even stop the medication. -We will recheck her TFTs today and adjust the dose of  methimazole accordingly -will stop atenolol if the TFTs are normal as her pulse is normal today -I advised her to return in 6 months  - time spent with the patient: 15 minutes, of which >50% was spent in obtaining information about her symptoms, reviewing her previous labs, evaluations, and treatments, counseling her about her condition (please see the discussed topics above), and developing a plan to further investigate and treat it; she had a number of questions which I addressed.  Needs refills of both meds.  Component     Latest Ref Rng & Units 11/11/2018  TSH     0.35 - 4.50 uIU/mL 1.87  T4,Free(Direct)     0.60 - 1.60 ng/dL 0.63  Triiodothyronine,Free,Serum     2.3 - 4.2 pg/mL 3.0  TSI     <140 % baseline 103   Normal TFTs and also TSI's on a very low dose of methimazole.  At this point, we will try to stop the medication and have her back for labs in 1.5 months.  Philemon Kingdom, MD PhD [facility] Endocrinology

## 2018-11-15 LAB — THYROID STIMULATING IMMUNOGLOBULIN: TSI: 103 % baseline (ref <140)

## 2018-11-22 ENCOUNTER — Other Ambulatory Visit: Payer: Self-pay | Admitting: Obstetrics and Gynecology

## 2018-11-22 DIAGNOSIS — N6320 Unspecified lump in the left breast, unspecified quadrant: Secondary | ICD-10-CM

## 2018-12-02 ENCOUNTER — Other Ambulatory Visit: Payer: No Typology Code available for payment source

## 2018-12-12 ENCOUNTER — Other Ambulatory Visit: Payer: No Typology Code available for payment source

## 2019-01-04 ENCOUNTER — Other Ambulatory Visit: Payer: Self-pay | Admitting: Obstetrics and Gynecology

## 2019-01-04 DIAGNOSIS — N6320 Unspecified lump in the left breast, unspecified quadrant: Secondary | ICD-10-CM

## 2019-01-10 ENCOUNTER — Ambulatory Visit
Admission: RE | Admit: 2019-01-10 | Discharge: 2019-01-10 | Disposition: A | Discharge status: Home / Self Care | Payer: No Typology Code available for payment source | Source: Ambulatory Visit | Attending: Obstetrics and Gynecology | Admitting: Obstetrics and Gynecology

## 2019-01-10 ENCOUNTER — Other Ambulatory Visit: Payer: Self-pay

## 2019-01-10 DIAGNOSIS — N6320 Unspecified lump in the left breast, unspecified quadrant: Secondary | ICD-10-CM

## 2019-02-02 ENCOUNTER — Other Ambulatory Visit: Payer: Self-pay

## 2019-02-02 ENCOUNTER — Ambulatory Visit: Payer: Self-pay

## 2019-02-02 ENCOUNTER — Other Ambulatory Visit: Payer: Self-pay | Admitting: Family Medicine

## 2019-02-02 DIAGNOSIS — M25561 Pain in right knee: Secondary | ICD-10-CM

## 2019-03-14 DIAGNOSIS — M25561 Pain in right knee: Secondary | ICD-10-CM | POA: Insufficient documentation

## 2019-04-13 ENCOUNTER — Encounter: Payer: Self-pay | Admitting: Internal Medicine

## 2019-05-16 ENCOUNTER — Ambulatory Visit: Payer: No Typology Code available for payment source | Admitting: Internal Medicine

## 2019-05-16 ENCOUNTER — Encounter: Payer: Self-pay | Admitting: Internal Medicine

## 2019-05-16 ENCOUNTER — Other Ambulatory Visit: Payer: Self-pay

## 2019-05-16 VITALS — BP 120/82 | HR 94 | Ht 59.65 in | Wt 248.0 lb

## 2019-05-16 DIAGNOSIS — E559 Vitamin D deficiency, unspecified: Secondary | ICD-10-CM

## 2019-05-16 DIAGNOSIS — E0500 Thyrotoxicosis with diffuse goiter without thyrotoxic crisis or storm: Secondary | ICD-10-CM | POA: Diagnosis not present

## 2019-05-16 NOTE — Progress Notes (Signed)
Patient ID: Carmen Henderson, female   DOB: [birth year], 49 y.o.   MRN: [id number]   This visit occurred during the SARS-CoV-2 public health emergency.  Safety protocols were in place, including screening questions prior to the visit, additional usage of staff PPE, and extensive cleaning of exam room while observing appropriate contact time as indicated for disinfecting solutions.   HPI  Carmen Henderson is a 49 y.o.-year-old female, initially referred by her PCP, Dr. Charlton Amor, returning for follow-up for Graves' disease.  Last visit 6 months ago.  Reviewed and addended history: She has a history of increased vaginal bleeding 2/2 fibroids >> hospitalized in 10/2017. She had a Hb 7.9 then and she was in shock. Given 2 units of blood and started on iron and Megace - now prn.  She had TAH in 01/2018.   We added methimazole 5 mg twice a day and atenolol 25 mg daily at last visit.  We decreased the dose to methimazole 5 mg once a day in 03/2017.  Afterwards, in 11/2017, we could decrease the dose to 2.5 mg daily.    We stopped methimazole completely in 10/2018.  She did not return for repeat labs afterwards.  Reviewed patient's TFTs: Labs checked by PCP 05/01/2019: TSH 4.843 (0.45-5.33) Lab Results  Component Value Date   TSH 1.87 11/11/2018   TSH 1.33 03/08/2018   TSH 1.88 12/20/2017   TSH 4.33 11/11/2017   TSH 1.755 11/05/2017   TSH 1.87 06/14/2017   TSH 4.18 04/01/2017   TSH 0.10 (L) 02/17/2017   TSH 0.03 (L) 01/14/2017   FREET4 0.63 11/11/2018   FREET4 0.65 03/08/2018   FREET4 0.66 12/20/2017   FREET4 0.68 11/11/2017   FREET4 0.65 06/14/2017   FREET4 0.55 (L) 04/01/2017   FREET4 0.68 02/17/2017   FREET4 0.92 01/14/2017  12/25/2016: TSH 0.188 (0.45-5.33), free T4 0.8 (0.6-1.4), free T3 4.51 (2.3-4.2), TPO Abs 834.2, ATA <1.8 12/04/2015: TSH 2.129  Her TSI antibodies normalized in 11/2017: Lab Results  Component Value Date   TSI 103 11/11/2018   TSI 119 12/20/2017    TSI 446 (H) 02/17/2017   Pt denies: - feeling nodules in neck - hoarseness - dysphagia - choking - SOB with lying down  Pt does have a FH of thyroid ds.: daughter and mother. No FH of thyroid cancer. No h/o radiation tx to head or neck.  No seaweed or kelp. No recent contrast studies. No herbal supplements. No Biotin use. No recent steroids use.   She has a history of migraines.  She had a slightly higher HbA1c at last check: 6.2% (04/2019).  No results found for: HGBA1C   VitD 18 (05/01/2019).  ROS: Constitutional: no weight gain/no weight loss, + fatigue in last 2 weeks, no subjective hyperthermia, no subjective hypothermia Eyes: no blurry vision, no xerophthalmia ENT: no sore throat, + see HPI Cardiovascular: no CP/no SOB/no palpitations/no leg swelling Respiratory: no cough/no SOB/no wheezing Gastrointestinal: no N/no V/no D/no C/no acid reflux Musculoskeletal: no muscle aches/no joint aches Skin: no rashes, no hair loss Neurological: no tremors/no numbness/no tingling/no dizziness  I reviewed pt's medications, allergies, PMH, social hx, family hx, and changes were documented in the history of present illness. Otherwise, unchanged from my initial visit note.  Past Medical History:  Diagnosis Date  . Anemia   . Dysrhythmia    tachycardia  . Fibroids   . Graves disease   . Headache   . History of blood transfusion 10/2017   [place] 2  units  . Hypothyroidism   . Migraine   . Seasonal allergies    Past Surgical History:  Procedure Laterality Date  . CARPAL TUNNEL RELEASE Right   . CARPAL TUNNEL RELEASE Left   . CESAREAN SECTION     x2  . FOOT SURGERY Left   . HYSTERECTOMY ABDOMINAL WITH SALPINGECTOMY Bilateral 02/03/2018   Procedure: HYSTERECTOMY ABDOMINAL WITH BILATERAL SALPINGECTOMY, LEFT OVARIAN CYSTECTOMY AND EXTENSIVE LYSIS OF ADHESIONS;  Surgeon: Azucena Fallen, MD;  Location: [facility];  Service: Gynecology;  Laterality: Bilateral;  2 1/2 hrs.  Everlean Alstrom GENERIC  HISTORICAL  05/29/2014   IR RADIOLOGIST EVAL & MGMT 05/29/2014 Sandi Mariscal, MD [facility] INTERV RAD - uterine embolization   . KNEE ARTHROPLASTY Right   . UPPER GI ENDOSCOPY     Social History   Social History  . Marital status: Single    Spouse name: N/A  . Number of children: 2   Occupational History  . Food and Lawyer   Social History Main Topics  . Smoking status: Never Smoker  . Smokeless tobacco: Never Used  . Alcohol use No  . Drug use: No   Current Outpatient Medications on File Prior to Visit  Medication Sig Dispense Refill  . aspirin-acetaminophen-caffeine (EXCEDRIN MIGRAINE) 250-250-65 MG per tablet Take 2 tablets by mouth every 6 (six) hours as needed for headache.    Marland Kitchen atenolol (TENORMIN) 25 MG tablet Take 1 tablet (25 mg total) by mouth daily. (Patient taking differently: Take 12.5 mg by mouth at bedtime. ) 90 tablet 3  . ferrous sulfate 325 (65 FE) MG EC tablet Take 325 mg by mouth every morning.     Marland Kitchen ibuprofen (ADVIL,MOTRIN) 600 MG tablet Take 1 tablet (600 mg total) by mouth every 6 (six) hours as needed for headache, mild pain or moderate pain. 30 tablet 0  . Multiple Vitamin (MULTIVITAMIN WITH MINERALS) TABS tablet Take 1 tablet by mouth daily.    Marland Kitchen zonisamide (ZONEGRAN) 50 MG capsule Take 50 mg by mouth.     No current facility-administered medications on file prior to visit.   No Known Allergies  FH: DM in sisters and mother HTN in mother and sister Heart ds in mother Br CA in mother, leukemia in father + see HPI  PE: BP 120/82   Pulse 94   Ht 4' 11.65" (1.515 m)   Wt 248 lb (112.5 kg)   LMP 10/30/2017 (Exact Date)   SpO2 97%   BMI 49.00 kg/m  Wt Readings from Last 3 Encounters:  05/16/19 248 lb (112.5 kg)  11/11/18 249 lb (112.9 kg)  02/04/18 261 lb 7.5 oz (118.6 kg)   Constitutional: overweight, in NAD Eyes: PERRLA, EOMI, no exophthalmos ENT: moist mucous membranes, no thyromegaly, no cervical lymphadenopathy Cardiovascular:  Tachycardia RR, No MRG Respiratory: CTA B Gastrointestinal: abdomen soft, NT, ND, BS+ Musculoskeletal: no deformities, strength intact in all 4 Skin: moist, warm, no rashes Neurological: no tremor with outstretched hands, DTR normal in all 4  ASSESSMENT: 1.  Graves' disease  2.  Vitamin D deficiency  PLAN:  1.  Patient with history of mildly low TSH and high free T3, without thyrotoxic symptoms: No weight loss, heat intolerance, tremors, palpitations, but she does have fatigue which has worsened in the last 2 weeks.  She also has a low vitamin D recently found, for which she started supplementation, and she also has night sweats and probably perimenopausal.  Both can contribute to increased fatigue. -her TSI antibodies were elevated  in the past giving her a diagnosis of Graves' disease, but they normalized and remained normal at last check. -We initially started her on methimazole 5 mg twice a day and atenolol 25 mg daily but we were able to decrease the dose in 03/1998 19 to 5 mg once a day and in 11/1998 19 to 2.5 mg once daily.  At last visit, we were able to stop methimazole completely (10/2018).  I advised her to return for repeat thyroid labs after stopping the medication, but she did not do so... -At this visit, she does not have any hyperthyroid symptoms or any complaints other than fatigue.  Her weight is maintained since last visit. -She had labs by PCP performed 3 weeks ago and the TSH returned high in the normal range, but still normal. -We again discussed about possible treatment for thyrotoxicosis, in case her Graves' disease recurs, including methimazole, RAI ablation, or, last resort, surgery.  She opted to restart methimazole if needed. -At last visit I advised her to stop atenolol as her TFTs were normal and her pulse was also normal.  At this visit, she has mild tachycardia on arrival, as she rushed over here, but heart rate normalized by the end of the appointment.  No need to  restart atenolol. -I advised her to return in 1 year but with labs in 2 months.  2.  Vitamin D deficiency -She was recently found to have a low vitamin D, at 76 -Discussed that this can contribute to her fatigue -She started 1000 units vitamin D daily, but I advised her to increase the dose to 2000 units daily and have another check in approximately 2 months. -She has an appointment with PCP tomorrow  Orders Placed This Encounter  Procedures  . TSH  . T4, free  . T3, free   Philemon Kingdom, MD PhD [facility] Endocrinology

## 2019-05-16 NOTE — Patient Instructions (Addendum)
Please continue to stay off methimazole.  Please come back for a follow-up appointment in 1 year but with labs in 2 months.

## 2019-05-18 DIAGNOSIS — M958 Other specified acquired deformities of musculoskeletal system: Secondary | ICD-10-CM | POA: Insufficient documentation

## 2019-05-18 DIAGNOSIS — M25372 Other instability, left ankle: Secondary | ICD-10-CM | POA: Insufficient documentation

## 2019-07-14 ENCOUNTER — Other Ambulatory Visit: Payer: Self-pay

## 2019-07-14 ENCOUNTER — Other Ambulatory Visit (INDEPENDENT_AMBULATORY_CARE_PROVIDER_SITE_OTHER): Payer: No Typology Code available for payment source

## 2019-07-14 ENCOUNTER — Other Ambulatory Visit: Payer: Self-pay | Admitting: Internal Medicine

## 2019-07-14 DIAGNOSIS — E0500 Thyrotoxicosis with diffuse goiter without thyrotoxic crisis or storm: Secondary | ICD-10-CM

## 2019-07-14 LAB — T4, FREE: Free T4: 0.66 ng/dL (ref 0.60–1.60)

## 2019-07-14 LAB — TSH: TSH: 1.54 u[IU]/mL (ref 0.35–4.50)

## 2019-07-14 LAB — T3, FREE: T3, Free: 4 pg/mL (ref 2.3–4.2)

## 2019-08-02 DIAGNOSIS — E559 Vitamin D deficiency, unspecified: Secondary | ICD-10-CM | POA: Insufficient documentation

## 2019-08-02 DIAGNOSIS — R350 Frequency of micturition: Secondary | ICD-10-CM | POA: Insufficient documentation

## 2019-10-11 DIAGNOSIS — S83249A Other tear of medial meniscus, current injury, unspecified knee, initial encounter: Secondary | ICD-10-CM | POA: Insufficient documentation

## 2020-02-05 ENCOUNTER — Other Ambulatory Visit: Payer: Self-pay | Admitting: Gastroenterology

## 2020-02-27 ENCOUNTER — Encounter (HOSPITAL_COMMUNITY): Payer: Self-pay | Admitting: Gastroenterology

## 2020-02-27 ENCOUNTER — Other Ambulatory Visit: Payer: Self-pay

## 2020-02-27 NOTE — Progress Notes (Signed)
Attempted to obtain medical history via telephone, unable to reach at this time. I left a voicemail to return pre surgical testing department's phone call.  

## 2020-02-28 ENCOUNTER — Other Ambulatory Visit (HOSPITAL_COMMUNITY)
Admission: RE | Admit: 2020-02-28 | Discharge: 2020-02-28 | Disposition: A | Discharge status: Home / Self Care | Payer: No Typology Code available for payment source | Source: Ambulatory Visit | Attending: Gastroenterology | Admitting: Gastroenterology

## 2020-02-28 DIAGNOSIS — Z20822 Contact with and (suspected) exposure to covid-19: Secondary | ICD-10-CM | POA: Insufficient documentation

## 2020-02-28 DIAGNOSIS — Z01812 Encounter for preprocedural laboratory examination: Secondary | ICD-10-CM | POA: Insufficient documentation

## 2020-02-28 LAB — SARS CORONAVIRUS 2 (TAT 6-24 HRS): SARS Coronavirus 2: NEGATIVE

## 2020-03-01 ENCOUNTER — Ambulatory Visit (HOSPITAL_COMMUNITY)
Admission: RE | Admit: 2020-03-01 | Discharge: 2020-03-01 | Disposition: A | Discharge status: Home / Self Care | Payer: No Typology Code available for payment source | Attending: Gastroenterology | Admitting: Gastroenterology

## 2020-03-01 ENCOUNTER — Ambulatory Visit (HOSPITAL_COMMUNITY): Payer: No Typology Code available for payment source | Admitting: Certified Registered Nurse Anesthetist

## 2020-03-01 ENCOUNTER — Encounter (HOSPITAL_COMMUNITY)
Admission: RE | Disposition: A | Discharge status: Home / Self Care | Payer: Self-pay | Source: Home / Self Care | Attending: Gastroenterology

## 2020-03-01 ENCOUNTER — Other Ambulatory Visit: Payer: Self-pay

## 2020-03-01 ENCOUNTER — Encounter (HOSPITAL_COMMUNITY): Payer: Self-pay | Admitting: Gastroenterology

## 2020-03-01 DIAGNOSIS — Z1211 Encounter for screening for malignant neoplasm of colon: Secondary | ICD-10-CM | POA: Insufficient documentation

## 2020-03-01 DIAGNOSIS — K5730 Diverticulosis of large intestine without perforation or abscess without bleeding: Secondary | ICD-10-CM | POA: Diagnosis not present

## 2020-03-01 DIAGNOSIS — Z96651 Presence of right artificial knee joint: Secondary | ICD-10-CM | POA: Diagnosis not present

## 2020-03-01 HISTORY — PX: COLONOSCOPY WITH PROPOFOL: SHX5780

## 2020-03-01 HISTORY — DX: Presence of spectacles and contact lenses: Z97.3

## 2020-03-01 SURGERY — COLONOSCOPY WITH PROPOFOL
Anesthesia: Monitor Anesthesia Care

## 2020-03-01 MED ORDER — PROPOFOL 500 MG/50ML IV EMUL
INTRAVENOUS | Status: DC | PRN
  Administered 2020-03-01: 50 mg via INTRAVENOUS

## 2020-03-01 MED ORDER — LACTATED RINGERS IV SOLN: INTRAVENOUS | Status: DC | PRN

## 2020-03-01 MED ORDER — SODIUM CHLORIDE 0.9 % IV SOLN: INTRAVENOUS | Status: DC

## 2020-03-01 MED ORDER — PROPOFOL 500 MG/50ML IV EMUL
INTRAVENOUS | Status: DC | PRN
  Administered 2020-03-01: 150 ug/kg/min via INTRAVENOUS

## 2020-03-01 MED ORDER — LACTATED RINGERS IV SOLN: Freq: Once | INTRAVENOUS | Status: AC

## 2020-03-01 SURGICAL SUPPLY — 22 items

## 2020-03-01 NOTE — Discharge Instructions (Signed)

## 2020-03-01 NOTE — Anesthesia Preprocedure Evaluation (Addendum)
Anesthesia Evaluation  Patient identified by MRN, date of birth, ID band Patient awake    Reviewed: Allergy & Precautions, NPO status , Patient's Chart, lab work & pertinent test results, reviewed documented beta blocker date and time   Airway Mallampati: III  TM Distance: >3 FB Neck ROM: Full    Dental no notable dental hx. (+) Teeth Intact, Dental Advisory Given   Pulmonary neg pulmonary ROS,    Pulmonary exam normal breath sounds clear to auscultation       Cardiovascular negative cardio ROS Normal cardiovascular exam Rhythm:Regular Rate:Normal     Neuro/Psych  Headaches, negative psych ROS   GI/Hepatic negative GI ROS, Neg liver ROS,   Endo/Other  Hypothyroidism Morbid obesity (BMI 49)  Renal/GU negative Renal ROS  negative genitourinary   Musculoskeletal negative musculoskeletal ROS (+)   Abdominal   Peds  Hematology negative hematology ROS (+)   Anesthesia Other Findings Colonoscopy for screening  Reproductive/Obstetrics                            Anesthesia Physical Anesthesia Plan  ASA: III  Anesthesia Plan: MAC   Post-op Pain Management:    Induction: Intravenous  PONV Risk Score and Plan: 2 and Propofol infusion and Treatment may vary due to age or medical condition  Airway Management Planned: Natural Airway  Additional Equipment:   Intra-op Plan:   Post-operative Plan:   Informed Consent: I have reviewed the patients History and Physical, chart, labs and discussed the procedure including the risks, benefits and alternatives for the proposed anesthesia with the patient or authorized representative who has indicated his/her understanding and acceptance.     Dental advisory given  Plan Discussed with: CRNA  Anesthesia Plan Comments:         Anesthesia Quick Evaluation

## 2020-03-01 NOTE — Op Note (Addendum)
[facility] Patient Name: Carmen Henderson Procedure Date: 03/01/2020 MRN: [id number] Attending MD: Carol Ada , MD Date of Birth: Oct 14, 1970 CSN: [id number] Age: 49 Admit Type: Outpatient Procedure:                Colonoscopy Indications:              Screening for colorectal malignant neoplasm Providers:                Carol Ada, MD, Benay Pillow, RN, Benetta Spar, Technician, Ladona Ridgel, Technician,                            Margurite Auerbach Referring MD:              Medicines:                Propofol per Anesthesia Complications:            No immediate complications. Estimated Blood Loss:     Estimated blood loss: none. Procedure:                Pre-Anesthesia Assessment:                           - Prior to the procedure, a History and Physical                            was performed, and patient medications and                            allergies were reviewed. The patient's tolerance of                            previous anesthesia was also reviewed. The risks                            and benefits of the procedure and the sedation                            options and risks were discussed with the patient.                            All questions were answered, and informed consent                            was obtained. Prior Anticoagulants: The patient has                            taken no previous anticoagulant or antiplatelet                            agents. ASA Grade Assessment: III - A patient with                            severe systemic  disease. After reviewing the risks                            and benefits, the patient was deemed in                            satisfactory condition to undergo the procedure.                           - Sedation was administered by an anesthesia                            professional. Deep sedation was attained.                           After obtaining informed  consent, the colonoscope                            was passed under direct vision. Throughout the                            procedure, the patient's blood pressure, pulse, and                            oxygen saturations were monitored continuously. The                            [id number]-H190DL ([id number]) Olympus pediatric colonscope                            was introduced through the anus and advanced to the                            the cecum, identified by appendiceal orifice and                            ileocecal valve. The colonoscopy was performed                            without difficulty. The patient tolerated the                            procedure well. The quality of the bowel                            preparation was excellent. The ileocecal valve,                            appendiceal orifice, and rectum were photographed. Scope In: 10:45:04 AM Scope Out: 10:55:56 AM Scope Withdrawal Time: 0 hours 8 minutes 59 seconds  Total Procedure Duration: 0 hours 10 minutes 52 seconds  Findings:      Scattered small and large-mouthed diverticula were found in the sigmoid       colon and ascending colon. The majority of the diverticulum were noted  in the sigmoid colon. Impression:               - Diverticulosis in the sigmoid colon and in the                            ascending colon.                           - No specimens collected. Moderate Sedation:      Not Applicable - Patient had care per Anesthesia. Recommendation:           - Patient has a contact number available for                            emergencies. The signs and symptoms of potential                            delayed complications were discussed with the                            patient. Return to normal activities tomorrow.                            Written discharge instructions were provided to the                            patient.                           - Resume previous diet.                            - Continue present medications.                           - Repeat colonoscopy in 10 years for screening                            purposes. Procedure Code(s):        --- Professional ---                           [id number], Colonoscopy, flexible; diagnostic, including                            collection of specimen(s) by brushing or washing,                            when performed (separate procedure) Diagnosis Code(s):        --- Professional ---                           Z12.11, Encounter for screening for malignant                            neoplasm of colon  K57.30, Diverticulosis of large intestine without                            perforation or abscess without bleeding CPT copyright 2019 American Medical Association. All rights reserved. The codes documented in this report are preliminary and upon coder review may  be revised to meet current compliance requirements. Carol Ada, MD Carol Ada, MD 03/01/2020 11:01:49 AM This report has been signed electronically. Number of Addenda: 0

## 2020-03-01 NOTE — Anesthesia Postprocedure Evaluation (Signed)
Anesthesia Post Note  Patient: Carmen Henderson  Procedure(s) Performed: COLONOSCOPY WITH PROPOFOL (N/A )     Patient location during evaluation: Endoscopy Anesthesia Type: MAC Level of consciousness: awake and alert Pain management: pain level controlled Vital Signs Assessment: post-procedure vital signs reviewed and stable Respiratory status: spontaneous breathing, nonlabored ventilation, respiratory function stable and patient connected to nasal cannula oxygen Cardiovascular status: blood pressure returned to baseline and stable Postop Assessment: no apparent nausea or vomiting Anesthetic complications: no   No complications documented.  Last Vitals:  Vitals:   03/01/20 1110 03/01/20 1120  BP: (!) 143/99 116/62  Pulse: 81 66  Resp: 18 15  Temp:    SpO2: 100% 100%    Last Pain:  Vitals:   03/01/20 1120  TempSrc:   PainSc: 0-No pain                 Carmen Henderson

## 2020-03-01 NOTE — Transfer of Care (Signed)
Immediate Anesthesia Transfer of Care Note  Patient: Carmen Henderson  Procedure(s) Performed: COLONOSCOPY WITH PROPOFOL (N/A )  Patient Location: PACU and Endoscopy Unit  Anesthesia Type:MAC  Level of Consciousness: awake, alert  and oriented  Airway & Oxygen Therapy: Patient Spontanous Breathing and Patient connected to face mask  Post-op Assessment: Report given to RN and Post -op Vital signs reviewed and stable  Post vital signs: Reviewed and stable  Last Vitals:  Vitals Value Taken Time  BP 139/91 03/01/20 1102  Temp    Pulse 84 03/01/20 1103  Resp 21 03/01/20 1103  SpO2 100 % 03/01/20 1103  Vitals shown include unvalidated device data.  Last Pain:  Vitals:   03/01/20 0955  TempSrc: Oral  PainSc: 0-No pain         Complications: No complications documented.

## 2020-03-01 NOTE — H&P (Signed)
  Carmen Henderson HPI: The patient is here for a routine screening colonoscopy. She denies any issues with hematochezia, melena, or abdominal pain.  Past Medical History:  Diagnosis Date  . Anemia   . Dysrhythmia    tachycardia  . Fibroids   . Graves disease   . Headache   . History of blood transfusion 10/2017   [place] 2 units  . Hypothyroidism   . Migraine   . Seasonal allergies   . Wears glasses     Past Surgical History:  Procedure Laterality Date  . CARPAL TUNNEL RELEASE Right   . CARPAL TUNNEL RELEASE Left   . CESAREAN SECTION     x2  . FOOT SURGERY Left   . HYSTERECTOMY ABDOMINAL WITH SALPINGECTOMY Bilateral 02/03/2018   Procedure: HYSTERECTOMY ABDOMINAL WITH BILATERAL SALPINGECTOMY, LEFT OVARIAN CYSTECTOMY AND EXTENSIVE LYSIS OF ADHESIONS;  Surgeon: Azucena Fallen, MD;  Location: [facility];  Service: Gynecology;  Laterality: Bilateral;  2 1/2 hrs.  Everlean Alstrom GENERIC HISTORICAL  05/29/2014   IR RADIOLOGIST EVAL & MGMT 05/29/2014 Sandi Mariscal, MD [facility] INTERV RAD - uterine embolization   . KNEE ARTHROPLASTY Right   . UPPER GI ENDOSCOPY      History reviewed. No pertinent family history.  Social History:  reports that she has never smoked. She has never used smokeless tobacco. She reports that she does not drink alcohol and does not use drugs.  Allergies: No Known Allergies  Medications: Scheduled: Continuous:  No results found for this or any previous visit (from the past 24 hour(s)).   No results found.  ROS:  As stated above in the HPI otherwise negative.  Blood pressure (!) 150/102, temperature 98.4 F (36.9 C), temperature source Oral, resp. rate 20, height 4\' 11"  (1.499 m), weight 109.8 kg, last menstrual period 10/30/2017, SpO2 100 %.    PE: Gen: NAD, Alert and Oriented HEENT:  [place]/AT, EOMI Neck: Supple, no LAD Lungs: CTA Bilaterally CV: RRR without M/G/R ABD: Soft, NTND, +BS, obese Ext: No C/C/E  Assessment/Plan: 1) Screening colonoscopy.  Carmen Henderson  D 03/01/2020, 10:34 AM

## 2020-05-15 ENCOUNTER — Other Ambulatory Visit: Payer: Self-pay

## 2020-05-15 ENCOUNTER — Ambulatory Visit: Payer: No Typology Code available for payment source | Admitting: Internal Medicine

## 2020-05-15 ENCOUNTER — Encounter: Payer: Self-pay | Admitting: Internal Medicine

## 2020-05-15 VITALS — BP 128/82 | HR 76 | Ht 59.0 in | Wt 249.0 lb

## 2020-05-15 DIAGNOSIS — R7303 Prediabetes: Secondary | ICD-10-CM | POA: Diagnosis not present

## 2020-05-15 DIAGNOSIS — E0500 Thyrotoxicosis with diffuse goiter without thyrotoxic crisis or storm: Secondary | ICD-10-CM

## 2020-05-15 DIAGNOSIS — E559 Vitamin D deficiency, unspecified: Secondary | ICD-10-CM

## 2020-05-15 LAB — T3, FREE: T3, Free: 3.3 pg/mL (ref 2.3–4.2)

## 2020-05-15 LAB — TSH: TSH: 2.59 u[IU]/mL (ref 0.35–4.50)

## 2020-05-15 LAB — HEMOGLOBIN A1C: Hgb A1c MFr Bld: 6.1 % (ref 4.6–6.5)

## 2020-05-15 LAB — T4, FREE: Free T4: 0.69 ng/dL (ref 0.60–1.60)

## 2020-05-15 NOTE — Patient Instructions (Addendum)
Please continue to stay off methimazole.  Decrease Atenolol to 12.5 mg daily x 1 week, then stop.  Continue vitamin D 2000 units daily.  Please stop at the lab.  Please come back for a follow-up appointment in 1 year.

## 2020-05-15 NOTE — Progress Notes (Addendum)
Patient ID: Carmen Henderson, female   DOB: Oct 19, 1970, 50 y.o.   MRN: [id number]   This visit occurred during the SARS-CoV-2 public health emergency.  Safety protocols were in place, including screening questions prior to the visit, additional usage of staff PPE, and extensive cleaning of exam room while observing appropriate contact time as indicated for disinfecting solutions.   HPI  Carmen Henderson is a 50 y.o.-year-old female, initially referred by her PCP, Dr. Charlton Amor, returning for follow-up for Graves' disease.  Last visit 1 year ago.  Reviewed and addended history: She has a history of increased vaginal bleeding 2/2 fibroids >> hospitalized in 10/2017. She had a Hb 7.9 then and she was in shock. Given 2 units of blood and started on iron and Megace - now prn.  She had TAH in 01/2018.   We added methimazole 5 mg twice a day and atenolol 25 mg daily at last visit.  We decreased the dose to methimazole 5 mg once a day in 03/2017.  Afterwards, in 11/2017, we could decrease the dose to 2.5 mg daily.    We stopped methimazole completely in 10/2018.    Labs remained normal afterwards.  Reviewed her TFTs: Lab Results  Component Value Date   TSH 1.54 07/14/2019   TSH 1.87 11/11/2018   TSH 1.33 03/08/2018   TSH 1.88 12/20/2017   TSH 4.33 11/11/2017   TSH 1.755 11/05/2017   TSH 1.87 06/14/2017   TSH 4.18 04/01/2017   TSH 0.10 (L) 02/17/2017   TSH 0.03 (L) 01/14/2017   FREET4 0.66 07/14/2019   FREET4 0.63 11/11/2018   FREET4 0.65 03/08/2018   FREET4 0.66 12/20/2017   FREET4 0.68 11/11/2017   FREET4 0.65 06/14/2017   FREET4 0.55 (L) 04/01/2017   FREET4 0.68 02/17/2017   FREET4 0.92 01/14/2017  Labs checked by PCP 05/01/2019: TSH 4.843 (0.45-5.33) 12/25/2016: TSH 0.188 (0.45-5.33), free T4 0.8 (0.6-1.4), free T3 4.51 (2.3-4.2), TPO Abs 834.2, ATA <1.8 12/04/2015: TSH 2.129  Her TSI antibodies normalized in 11/2017 and remain low: Lab Results  Component Value Date    TSI 103 11/11/2018   TSI 119 12/20/2017   TSI 446 (H) 02/17/2017   Pt denies: - feeling nodules in neck - hoarseness - dysphagia - choking - SOB with lying down  Pt does have a FH of thyroid ds.: daughter and mother. No FH of thyroid cancer. No h/o radiation tx to head or neck.  No herbal supplements. + Biotin use - not in last month. No recent steroids use.   She has a history of migraines.  She had a slightly higher HbA1c at last check: 6.2% (04/2019).  No results found for: HGBA1C   She has a history of vitamin D deficiency: VitD 18 (05/01/2019) No results found for: VD25OH   I advised her to increase her vitamin D supplement from 1000 to 2000 units daily in 04/2019.  She did not return for recheck.  Last summer she was started on oxybutynin for increased urination.  She works at [employer] place but will start a new job soon.  ROS: + See HPI  Constitutional: no weight gain/+ intentional weight loss (10 lbs), no fatigue,  + subjective hypothermia, + increased urination Eyes: no blurry vision, no xerophthalmia ENT: no sore throat, + see HPI Cardiovascular: no CP/no SOB/no palpitations/no leg swelling Respiratory: no cough/no SOB/no wheezing Gastrointestinal: no N/no V/no D/no C/no acid reflux Musculoskeletal: no muscle aches/no joint aches Skin: no rashes, no hair loss Neurological:  no tremors/no numbness/no tingling/no dizziness  I reviewed pt's medications, allergies, PMH, social hx, family hx, and changes were documented in the history of present illness. Otherwise, unchanged from my initial visit note.  Past Medical History:  Diagnosis Date  . Anemia   . Dysrhythmia    tachycardia  . Fibroids   . Graves disease   . Headache   . History of blood transfusion 10/2017   [place] 2 units  . Hypothyroidism   . Migraine   . Seasonal allergies   . Wears glasses    Past Surgical History:  Procedure Laterality Date  . CARPAL TUNNEL RELEASE Right   . CARPAL TUNNEL  RELEASE Left   . CESAREAN SECTION     x2  . COLONOSCOPY WITH PROPOFOL N/A 03/01/2020   Procedure: COLONOSCOPY WITH PROPOFOL;  Surgeon: Carol Ada, MD;  Location: [facility];  Service: Endoscopy;  Laterality: N/A;  . FOOT SURGERY Left   . HYSTERECTOMY ABDOMINAL WITH SALPINGECTOMY Bilateral 02/03/2018   Procedure: HYSTERECTOMY ABDOMINAL WITH BILATERAL SALPINGECTOMY, LEFT OVARIAN CYSTECTOMY AND EXTENSIVE LYSIS OF ADHESIONS;  Surgeon: Azucena Fallen, MD;  Location: [facility];  Service: Gynecology;  Laterality: Bilateral;  2 1/2 hrs.  Everlean Alstrom GENERIC HISTORICAL  05/29/2014   IR RADIOLOGIST EVAL & MGMT 05/29/2014 Sandi Mariscal, MD [facility] INTERV RAD - uterine embolization   . KNEE ARTHROPLASTY Right   . UPPER GI ENDOSCOPY     Social History   Social History  . Marital status: Single    Spouse name: N/A  . Number of children: 2   Occupational History  . Food and Lawyer   Social History Main Topics  . Smoking status: Never Smoker  . Smokeless tobacco: Never Used  . Alcohol use No  . Drug use: No   Current Outpatient Medications on File Prior to Visit  Medication Sig Dispense Refill  . acetaminophen (TYLENOL) 325 MG tablet Take 650 mg by mouth every 6 (six) hours as needed.    Marland Kitchen atenolol (TENORMIN) 25 MG tablet Take 1 tablet (25 mg total) by mouth daily. (Patient taking differently: Take 50 mg by mouth at bedtime.) 90 tablet 3  . b complex vitamins capsule Take 1 capsule by mouth daily.    . Biotin 5000 MCG TABS Take 5,000 mg by mouth daily.    . Calcium-Magnesium-Zinc (CAL-MAG-ZINC PO) Take 1 tablet by mouth daily.    . Multiple Vitamin (MULTIVITAMIN WITH MINERALS) TABS tablet Take 1 tablet by mouth daily.    Marland Kitchen oxybutynin (DITROPAN-XL) 10 MG 24 hr tablet Take 10 mg by mouth at bedtime.     . Vitamin D3 (VITAMIN D) 25 MCG tablet Take 1,000 Units by mouth daily.    Marland Kitchen zonisamide (ZONEGRAN) 50 MG capsule Take 100 mg by mouth at bedtime.      No current facility-administered medications  on file prior to visit.   No Known Allergies  FH: DM in sisters and mother HTN in mother and sister Heart ds in mother Br CA in mother, leukemia in father + see HPI  PE: BP 128/82   Pulse 76   Ht 4\' 11"  (1.499 m)   Wt 249 lb (112.9 kg)   LMP 10/30/2017 (Exact Date)   SpO2 99%   BMI 50.29 kg/m  Wt Readings from Last 3 Encounters:  05/15/20 249 lb (112.9 kg)  03/01/20 242 lb 1 oz (109.8 kg)  05/16/19 248 lb (112.5 kg)   Constitutional: overweight, in NAD Eyes: PERRLA, EOMI, no exophthalmos ENT: moist  mucous membranes, no thyromegaly, no cervical lymphadenopathy Cardiovascular: RRR, No MRG Respiratory: CTA B Gastrointestinal: abdomen soft, NT, ND, BS+ Musculoskeletal: no deformities, strength intact in all 4 Skin: moist, warm, no rashes Neurological: no tremor with outstretched hands, DTR normal in all 4  ASSESSMENT: 1.  Graves' disease  2.  Vitamin D deficiency  3. Prediabetes  PLAN:  1.  Patient with history of mildly low TSH and high free T3, without thyrotoxic symptoms: She denies weight loss, heat intolerance, tremors, palpitations, but had fatigue.  She also had a low vitamin D which could have contributed to the fatigue.  She was having night sweats and was most likely perimenopausal, which could also have contributed. -Her TSI antibodies were elevated in the past, pointing towards a diagnosis of Graves' disease, but they normalized at last checks -We initially started her on methimazole 5 mg twice a day and atenolol 25 mg daily but we were able to decrease the dose in 03/2017 to 5 mg once a day in in 11/2017 to 2.5 mg once a day.  We were able to stop methimazole completely in 10/2018. We also stopped metoprolol afterwards -At last visit, TFTs were normal so we kept her off methimazole. -At today's visit, he does not report hyperthyroid symptoms. She tells me she has cold intolerance and I wonder whether oxybutynin may be contributing to this. She tells me that  she is planning to come off soon. -At today's visit, we will recheck her TFTs and discussed about possible treatment for thyrotoxicosis, in case her Graves' disease recurs, including methimazole, RAI ablation, or, last resort, surgery.  She would like to restart methimazole if needed. -I advised her to return in 1 year  2.  Vitamin D deficiency -She had a low vitamin D level, at 18, in the past -At last visit we increased the dose of her vitamin D supplement from 1000 to 2000 units daily -She did not return for recheck after the increase in dose -We will recheck the vitamin D level today  3. Prediabetes -Reviewed latest HbA1c from a year ago and this was 6.2%  -we will recheck this today  Component     Latest Ref Rng & Units 05/15/2020  TSH     0.35 - 4.50 uIU/mL 2.59  T4,Free(Direct)     0.60 - 1.60 ng/dL 0.69  Triiodothyronine,Free,Serum     2.3 - 4.2 pg/mL 3.3  Hemoglobin A1C     4.6 - 6.5 % 6.1  TFTs are excellent.  HbA1c is slightly better.  Component     Latest Ref Rng & Units 05/15/2020  Vitamin D, 25-Hydroxy     30.0 - 100.0 ng/mL 21.3 (L)  Vitamin D level is low.  We will increase her vitamin D from 2000 units to 4000 units daily and recheck the level in 2 months.  Philemon Kingdom, MD PhD [facility] Endocrinology

## 2020-05-16 LAB — VITAMIN D 25 HYDROXY (VIT D DEFICIENCY, FRACTURES): Vit D, 25-Hydroxy: 21.3 ng/mL — ABNORMAL LOW (ref 30.0–100.0)

## 2020-05-16 NOTE — Addendum Note (Signed)
Addended by: Philemon Kingdom on: 05/16/2020 02:07 PM   Modules accepted: Orders

## 2020-06-12 ENCOUNTER — Other Ambulatory Visit: Payer: Self-pay | Admitting: Obstetrics and Gynecology

## 2020-06-12 DIAGNOSIS — N644 Mastodynia: Secondary | ICD-10-CM

## 2020-07-25 ENCOUNTER — Other Ambulatory Visit: Payer: No Typology Code available for payment source

## 2020-08-26 IMAGING — MG DIGITAL DIAGNOSTIC BILAT W/ TOMO W/ CAD
8 of 14 series · 8 of 40 positions shown · non-contrast
Comparison: Previous exam(s).

CLINICAL DATA: Follow-up for a probably benign left breast mass.

EXAM:
DIGITAL DIAGNOSTIC BILATERAL MAMMOGRAM WITH CAD AND TOMO
ULTRASOUND LEFT BREAST

[L MLO synth-2D (1 of 2)]
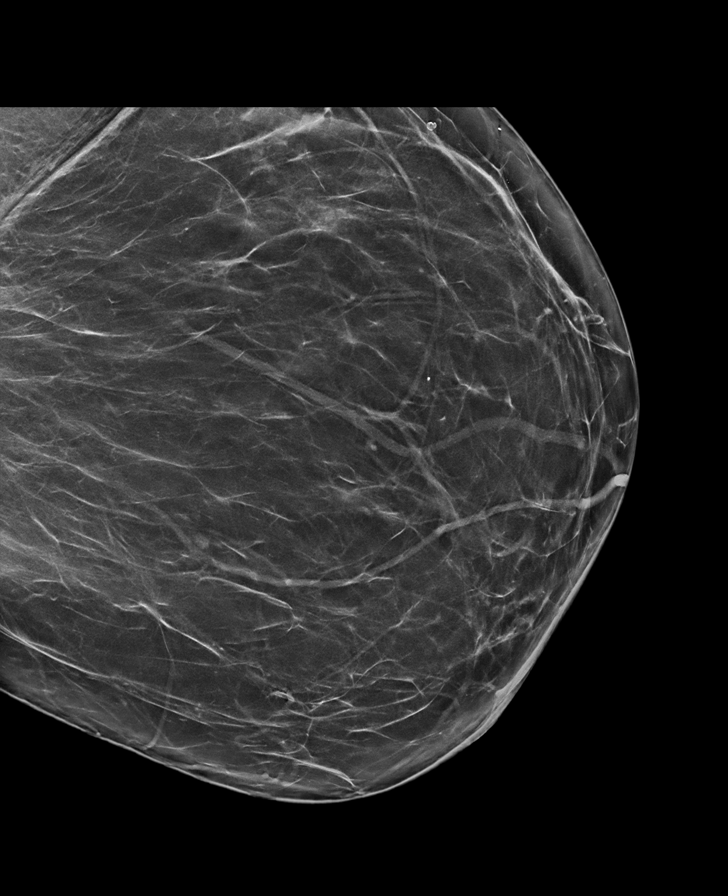

[L CC synth-2D (1 of 2)]
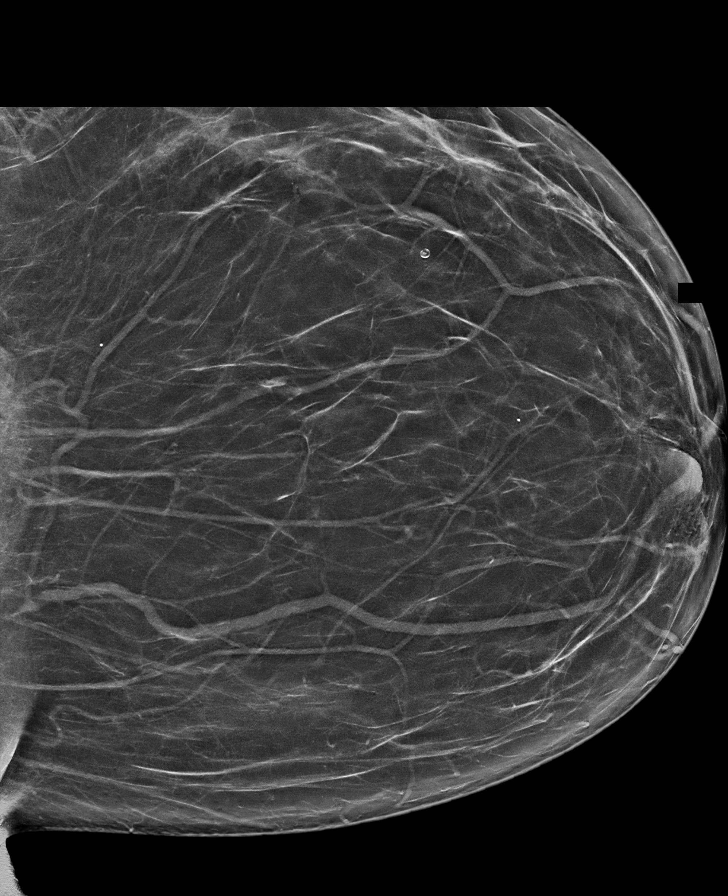

[L CC synth-2D (2 of 2)]
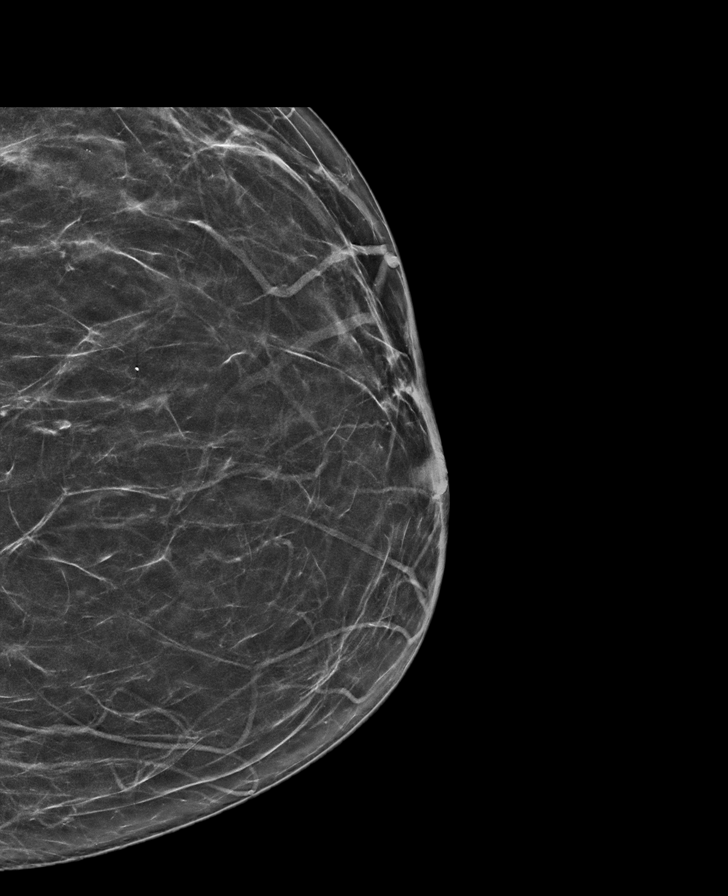

[R MLO synth-2D (1 of 2)]
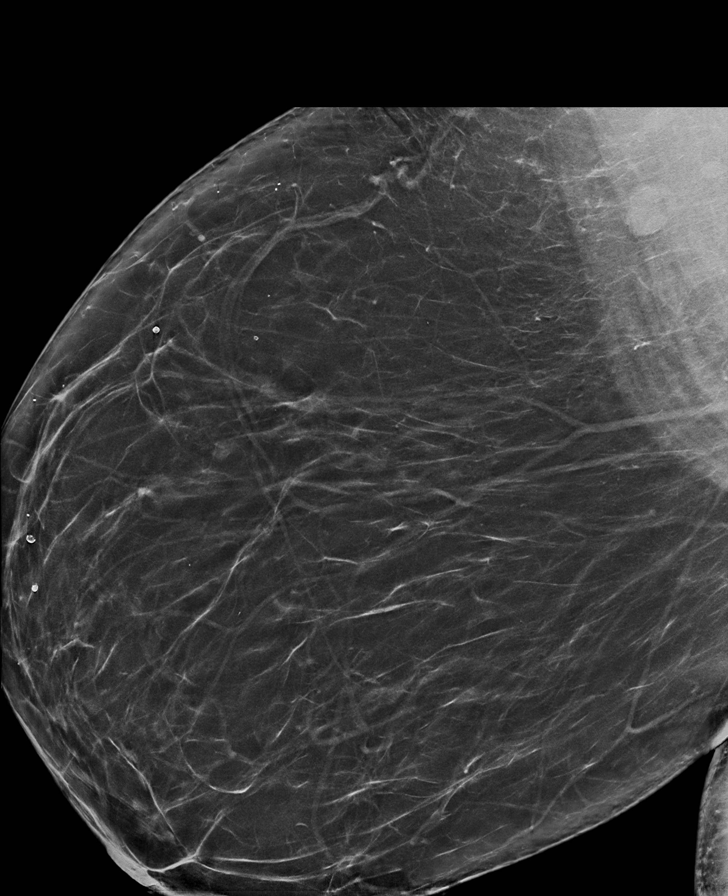

[R MLO synth-2D (2 of 2)]
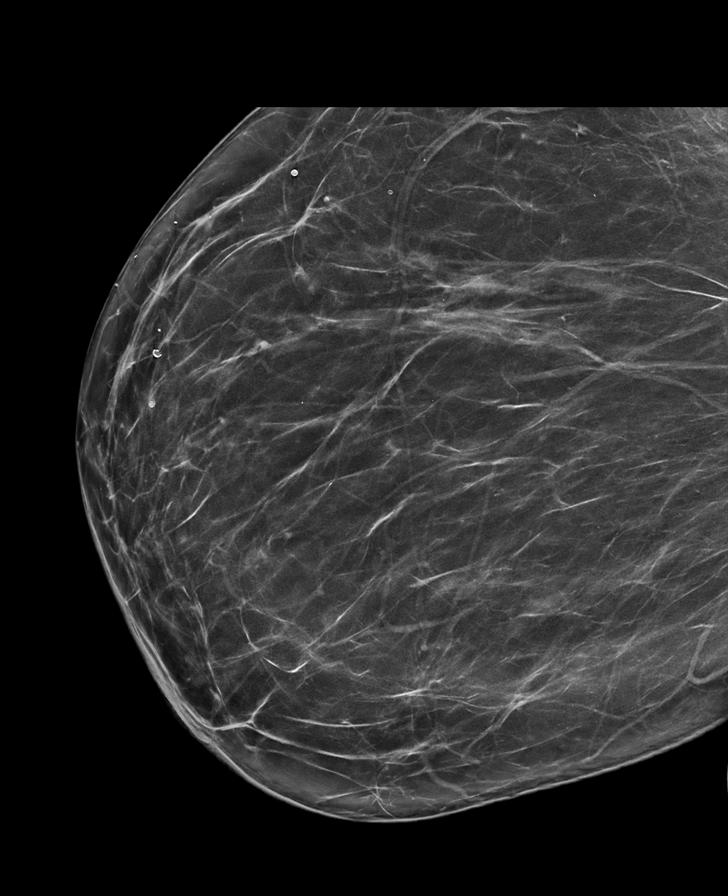

[L MLO synth-2D (2 of 2)]
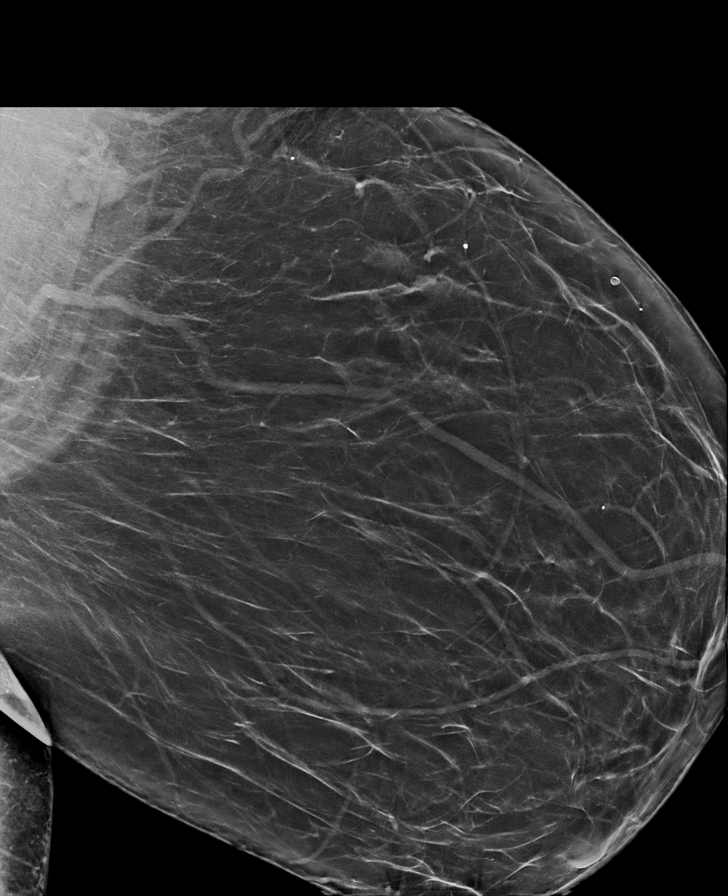

[R CC synth-2D]
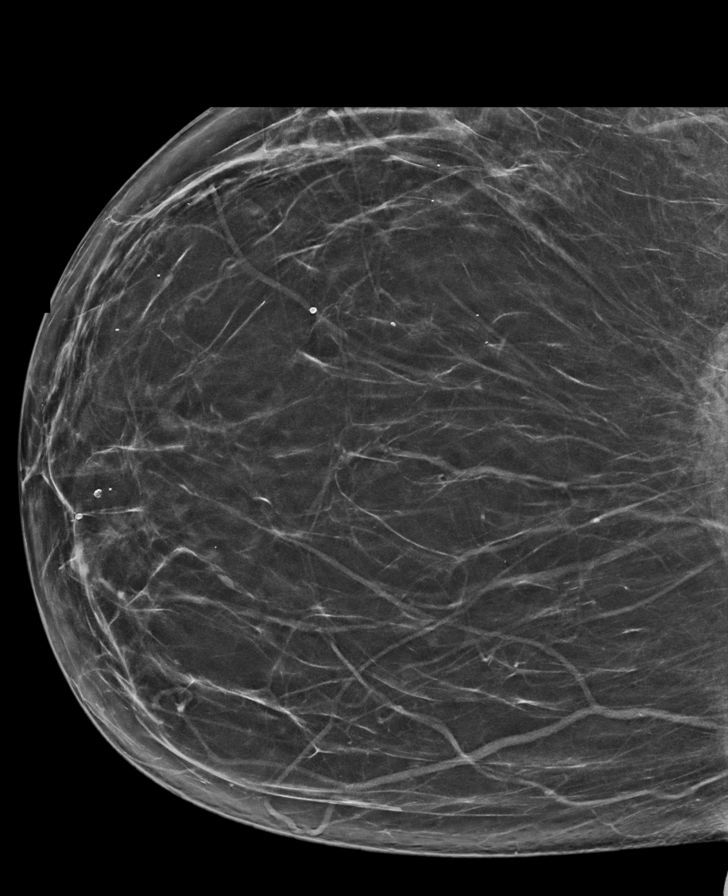

[R MLO tomo · tomo slice 47/92.0]
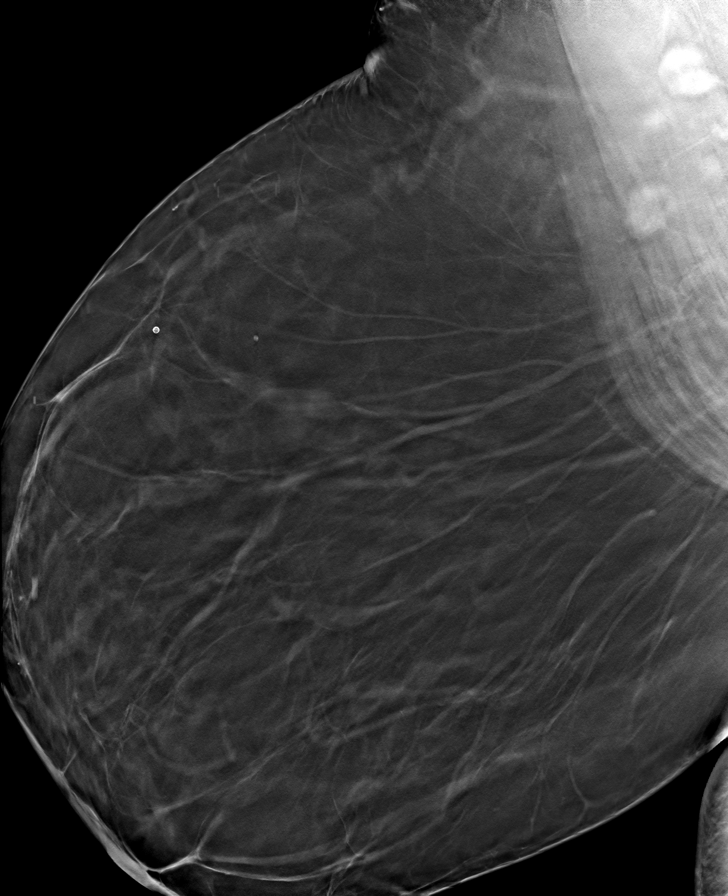

[8 of 40 positions shown; findings below may reference images not displayed]

ACR Breast Density Category b: There are scattered areas of
fibroglandular density.
FINDINGS: No suspicious calcifications, masses or areas of distortion are seen
in the bilateral breasts.

Mammographic images were processed with CAD.

The mass in the left breast at [DATE], 12 cm from the nipple measures
1.8 x 0.4 x 0.9 cm, previously measuring 2.0 x 0.9 x 1.0 cm in [DATE]. The left breast mass at [DATE] has demonstrated 2 years of
stability, and is therefore benign.

2.  No mammographic evidence of malignancy in the bilateral breasts.

RECOMMENDATION:
Screening mammogram in one year.(Code:TM-B-Y12)

I have discussed the findings and recommendations with the patient.
If applicable, a reminder letter will be sent to the patient
regarding the next appointment.

BI-RADS CATEGORY  2: Benign.

## 2020-12-03 DIAGNOSIS — E119 Type 2 diabetes mellitus without complications: Secondary | ICD-10-CM | POA: Insufficient documentation

## 2021-05-20 ENCOUNTER — Ambulatory Visit: Payer: 59 | Admitting: Internal Medicine

## 2021-05-20 ENCOUNTER — Other Ambulatory Visit: Payer: Self-pay

## 2021-05-20 ENCOUNTER — Encounter: Payer: Self-pay | Admitting: Internal Medicine

## 2021-05-20 VITALS — BP 120/78 | HR 69 | Ht 59.0 in | Wt 260.6 lb

## 2021-05-20 DIAGNOSIS — E559 Vitamin D deficiency, unspecified: Secondary | ICD-10-CM

## 2021-05-20 DIAGNOSIS — R7303 Prediabetes: Secondary | ICD-10-CM | POA: Diagnosis not present

## 2021-05-20 DIAGNOSIS — E0500 Thyrotoxicosis with diffuse goiter without thyrotoxic crisis or storm: Secondary | ICD-10-CM | POA: Diagnosis not present

## 2021-05-20 LAB — VITAMIN D 25 HYDROXY (VIT D DEFICIENCY, FRACTURES): VITD: 54.85 ng/mL (ref 30.00–100.00)

## 2021-05-20 LAB — POCT GLYCOSYLATED HEMOGLOBIN (HGB A1C): Hemoglobin A1C: 6.2 % — AB (ref 4.0–5.6)

## 2021-05-20 LAB — TSH: TSH: 3.55 u[IU]/mL (ref 0.35–5.50)

## 2021-05-20 LAB — T4, FREE: Free T4: 0.58 ng/dL — ABNORMAL LOW (ref 0.60–1.60)

## 2021-05-20 LAB — T3, FREE: T3, Free: 3.1 pg/mL (ref 2.3–4.2)

## 2021-05-20 NOTE — Progress Notes (Signed)
Patient ID: Carmen Henderson, female   DOB: 1971-03-22, 51 y.o.   MRN: [id number]   This visit occurred during the SARS-CoV-2 public health emergency.  Safety protocols were in place, including screening questions prior to the visit, additional usage of staff PPE, and extensive cleaning of exam room while observing appropriate contact time as indicated for disinfecting solutions.   HPI  Carmen Henderson is a 51 y.o.-year-old female, initially referred by her PCP, Dr. Charlton Amor, returning for follow-up for Graves' disease.  Last visit 1 year ago.  Interim history: She has increased urination - on Oxybutynin (tried to stop but did not work), nausea, chest pain. Also, no tremors, palpitations, heat intolerance, anxiety, insomnia. She had Covid19 in 01/2021. Her neurologist restarted Atenolol for migraines - since last OV. She tried Ozempic for weight loss >> but she developed blurry vision and stopped after 4 doses >> will see ophthalmology. She started to exercise on the treadmill, also stopped sweet tea and chips.  She initially lost 17 pounds, but then she gained them back.  Reviewed history: She has a history of increased vaginal bleeding 2/2 fibroids >> hospitalized in 10/2017. She had a Hb 7.9 then and she was in shock. Given 2 units of blood and started on iron and Megace -changed to prn.  She had TAH in 01/2018.   We added methimazole 5 mg twice a day and atenolol 25 mg daily.  We decreased the dose to methimazole 5 mg once a day in 03/2017.  Afterwards, in 11/2017, we could decrease the dose to 2.5 mg daily.    We stopped methimazole completely in 10/2018.    Labs remained normal afterwards.  Reviewed her TFTs: Lab Results  Component Value Date   TSH 2.59 05/15/2020   TSH 1.54 07/14/2019   TSH 1.87 11/11/2018   TSH 1.33 03/08/2018   TSH 1.88 12/20/2017   TSH 4.33 11/11/2017   TSH 1.755 11/05/2017   TSH 1.87 06/14/2017   TSH 4.18 04/01/2017   TSH 0.10 (L) 02/17/2017    FREET4 0.69 05/15/2020   FREET4 0.66 07/14/2019   FREET4 0.63 11/11/2018   FREET4 0.65 03/08/2018   FREET4 0.66 12/20/2017   FREET4 0.68 11/11/2017   FREET4 0.65 06/14/2017   FREET4 0.55 (L) 04/01/2017   FREET4 0.68 02/17/2017   FREET4 0.92 01/14/2017  Labs checked by PCP 05/01/2019: TSH 4.843 (0.45-5.33) 12/25/2016: TSH 0.188 (0.45-5.33), free T4 0.8 (0.6-1.4), free T3 4.51 (2.3-4.2), TPO Abs 834.2, ATA <1.8 12/04/2015: TSH 2.129  Her TSI antibodies normalized in 11/2017 and remain low: Lab Results  Component Value Date   TSI 103 11/11/2018   TSI 119 12/20/2017   TSI 446 (H) 02/17/2017   Pt denies: - feeling nodules in neck - hoarseness - dysphagia - choking - SOB with lying down  Pt does have a FH of thyroid ds.: daughter and mother. No FH of thyroid cancer. No h/o radiation tx to head or neck.  No herbal supplements. + Biotin use - not in 2 weeks. No recent steroids use.   HbA1c levels: 11-02/2021: HbA1c 6.3% Lab Results  Component Value Date   HGBA1C 6.1 05/15/2020    She has a history of vitamin D deficiency: Lab Results  Component Value Date   VD25OH 21.3 (L) 05/15/2020  VitD 18 (05/01/2019)  I advised her to increase her vitamin D supplement from 1000 to 2000 units daily in 04/2019.  In 04/2020, we increased the dose from 2000 to 4000 units.  She did not return for recheck.  In summer 2021, she started oxybutynin for increased urination. She also has a history of migraines.  ROS: + See HPI   I reviewed pt's medications, allergies, PMH, social hx, family hx, and changes were documented in the history of present illness. Otherwise, unchanged from my initial visit note.  Past Medical History:  Diagnosis Date   Anemia    Dysrhythmia    tachycardia   Fibroids    Graves disease    Headache    History of blood transfusion 10/2017   [place] 2 units   Hypothyroidism    Migraine    Seasonal allergies    Wears glasses    Past Surgical History:  Procedure  Laterality Date   CARPAL TUNNEL RELEASE Right    CARPAL TUNNEL RELEASE Left    CESAREAN SECTION     x2   COLONOSCOPY WITH PROPOFOL N/A 03/01/2020   Procedure: COLONOSCOPY WITH PROPOFOL;  Surgeon: Carol Ada, MD;  Location: [facility];  Service: Endoscopy;  Laterality: N/A;   FOOT SURGERY Left    HYSTERECTOMY ABDOMINAL WITH SALPINGECTOMY Bilateral 02/03/2018   Procedure: HYSTERECTOMY ABDOMINAL WITH BILATERAL SALPINGECTOMY, LEFT OVARIAN CYSTECTOMY AND EXTENSIVE LYSIS OF ADHESIONS;  Surgeon: Azucena Fallen, MD;  Location: [facility];  Service: Gynecology;  Laterality: Bilateral;  2 1/2 hrs.   IR GENERIC HISTORICAL  05/29/2014   IR RADIOLOGIST EVAL & MGMT 05/29/2014 Sandi Mariscal, MD [facility] INTERV RAD - uterine embolization    KNEE ARTHROPLASTY Right    UPPER GI ENDOSCOPY     Social History   Social History   Marital status: Single    Spouse name: N/A   Number of children: 2   Occupational History   Physicist, medical   Social History Main Topics   Smoking status: Never Smoker   Smokeless tobacco: Never Used   Alcohol use No   Drug use: No   Current Outpatient Medications on File Prior to Visit  Medication Sig Dispense Refill   acetaminophen (TYLENOL) 325 MG tablet Take 650 mg by mouth every 6 (six) hours as needed.     atenolol (TENORMIN) 25 MG tablet Take 1 tablet (25 mg total) by mouth daily. (Patient taking differently: Take 50 mg by mouth at bedtime.) 90 tablet 3   b complex vitamins capsule Take 1 capsule by mouth daily.     Biotin 5000 MCG TABS Take 5,000 mg by mouth daily.     Calcium-Magnesium-Zinc (CAL-MAG-ZINC PO) Take 1 tablet by mouth daily.     Multiple Vitamin (MULTIVITAMIN WITH MINERALS) TABS tablet Take 1 tablet by mouth daily.     oxybutynin (DITROPAN-XL) 10 MG 24 hr tablet Take 10 mg by mouth at bedtime.      Vitamin D3 (VITAMIN D) 25 MCG tablet Take 1,000 Units by mouth daily.     zonisamide (ZONEGRAN) 50 MG capsule Take 100 mg by mouth at bedtime.      No  current facility-administered medications on file prior to visit.   No Known Allergies  FH: DM in sisters and mother HTN in mother and sister Heart ds in mother Br CA in mother, leukemia in father + see HPI  PE: BP 120/78 (BP Location: Right Arm, Patient Position: Sitting, Cuff Size: Normal)    Pulse 69    Ht 4\' 11"  (1.499 m)    Wt 260 lb 9.6 oz (118.2 kg)    LMP 10/30/2017 (Exact Date)    SpO2 99%    BMI  52.63 kg/m  Wt Readings from Last 3 Encounters:  05/20/21 260 lb 9.6 oz (118.2 kg)  05/15/20 249 lb (112.9 kg)  03/01/20 242 lb 1 oz (109.8 kg)   Constitutional: overweight, in NAD Eyes: PERRLA, EOMI, no exophthalmos ENT: moist mucous membranes, no thyromegaly, no cervical lymphadenopathy Cardiovascular: RRR, No MRG Respiratory: CTA B Musculoskeletal: no deformities, strength intact in all 4 Skin: moist, warm, no rashes Neurological: no tremor with outstretched hands, DTR normal in all 4  ASSESSMENT: 1.  Graves' disease  2.  Vitamin D deficiency  3. Prediabetes  PLAN:  1.  Patient with history of mildly low TSH and high free T4, without thyrotoxic symptoms.  No weight loss, heat intolerance, tremors, palpitations.  She did have fatigue.  However, she also had a low vitamin D and was also perimenopausal, which could have contributed to fatigue.  She was also having night sweats, most likely perimenopausal. -Her TSI antibodies were elevated in the past, pointing towards a diagnosis of Graves' disease, but they normalized at last check -We initially had her on methimazole 5 mg twice a day and atenolol 25 mg daily but we were able to decrease the dose and then stop methimazole completely in 10/2018.  We were also able to stop her beta-blocker afterwards.  However, since last visit, her neurologist restarted atenolol for headaches.  This helps. -At last visit, TFTs were normal so we continued her off methimazole -At today's visit, she does not report hyperthyroid symptoms.  She  had cold intolerance and we discussed that oxybutynin could have contributed.  She was not able to come off the medication since last visit, but her OB/GYN is recommending to stop it. -We will recheck her TFTs today and may need to restart methimazole if they are abnormal.   -I advised her to return in 1 year  2.  Vitamin D deficiency -She had a low vitamin D level, at 18, in the past -At last visit, vitamin D was only slightly higher, at 21.3.  I advised her to increase her supplement dose from 2000 to 4000 units daily and return for a recheck.  She did not return. -We will recheck her vitamin D level today  3. Prediabetes -Reviewed latest HbA1c from a year ago and this was 6.1%, slightly lower than 6.2% obtained at the previous visit.  Since last visit she had another HbA1c by PCP and this was 6.3%, higher -We rechecked this today  >> 6.2%, slightly lower -she gained 11 lbs since last OV -She tried Ozempic, but this caused some blurry vision and she stopped -I recommended that, weight management clinic.  She will think about this and let me know if she wants a referral.  Component     Latest Ref Rng & Units 05/20/2021  TSH     0.35 - 5.50 uIU/mL 3.55  T4,Free(Direct)     0.60 - 1.60 ng/dL 0.58 (L)  Triiodothyronine,Free,Serum     2.3 - 4.2 pg/mL 3.1  VITD     30.00 - 100.00 ng/mL 54.85     Philemon Kingdom, MD PhD [facility] Endocrinology

## 2021-05-20 NOTE — Patient Instructions (Signed)
Please continue to stay off methimazole.  Continue vitamin D 4000 units daily.  Please stop at the lab.  Please come back for a follow-up appointment in 1 year.  

## 2021-07-17 ENCOUNTER — Ambulatory Visit: Payer: 59 | Admitting: Podiatry

## 2021-07-31 ENCOUNTER — Ambulatory Visit (INDEPENDENT_AMBULATORY_CARE_PROVIDER_SITE_OTHER): Payer: 59

## 2021-07-31 ENCOUNTER — Ambulatory Visit: Payer: 59 | Admitting: Podiatry

## 2021-07-31 ENCOUNTER — Encounter: Payer: Self-pay | Admitting: Podiatry

## 2021-07-31 DIAGNOSIS — M7752 Other enthesopathy of left foot: Secondary | ICD-10-CM | POA: Diagnosis not present

## 2021-07-31 DIAGNOSIS — M778 Other enthesopathies, not elsewhere classified: Secondary | ICD-10-CM | POA: Diagnosis not present

## 2021-07-31 MED ORDER — CELECOXIB 200 MG PO CAPS: 200.0000 mg | ORAL_CAPSULE | Freq: Two times a day (BID) | ORAL | 1 refills | Status: AC

## 2021-08-03 NOTE — Progress Notes (Signed)
?Subjective:  ?Patient ID: Carmen Henderson, female    DOB: [birth year],  MRN: [id number] ?HPI ?Chief Complaint  ?Patient presents with  ? Foot Pain  ?  Dorsal midfoot/anterior ankle left - injury on May 2020-xrayed-no fx, bad sprain, did PT and wore boot, no better-had surgery March 2021, PT/boot for 6 months, no better, foot still very swollen, painful, had recent MRI-told her she needed surgery again, tried cortisone-helped for about a week  ? Diabetes  ?  Last a1c was 6.2  ? New Patient (Initial Visit)  ? ? ?51 y.o. female presents with the above complaint.  ? ?ROS: Denies fever chills nausea vomiting muscle aches pains calf pain back pain chest pain shortness of breath.  Hemoglobin A1c is at 6.1 ? ?Past Medical History:  ?Diagnosis Date  ? Anemia   ? Dysrhythmia   ? tachycardia  ? Fibroids   ? Graves disease   ? Headache   ? History of blood transfusion 10/2017  ? [place] 2 units  ? Hypothyroidism   ? Migraine   ? Seasonal allergies   ? Wears glasses   ? ?Past Surgical History:  ?Procedure Laterality Date  ? CARPAL TUNNEL RELEASE Right   ? CARPAL TUNNEL RELEASE Left   ? CESAREAN SECTION    ? x2  ? COLONOSCOPY WITH PROPOFOL N/A 03/01/2020  ? Procedure: COLONOSCOPY WITH PROPOFOL;  Surgeon: Carol Ada, MD;  Location: [facility];  Service: Endoscopy;  Laterality: N/A;  ? FOOT SURGERY Left   ? HYSTERECTOMY ABDOMINAL WITH SALPINGECTOMY Bilateral 02/03/2018  ? Procedure: HYSTERECTOMY ABDOMINAL WITH BILATERAL SALPINGECTOMY, LEFT OVARIAN CYSTECTOMY AND EXTENSIVE LYSIS OF ADHESIONS;  Surgeon: Azucena Fallen, MD;  Location: [facility];  Service: Gynecology;  Laterality: Bilateral;  2 1/2 hrs.  ? IR GENERIC HISTORICAL  05/29/2014  ? IR RADIOLOGIST EVAL & MGMT 05/29/2014 Sandi Mariscal, MD [facility] INTERV RAD - uterine embolization   ? KNEE ARTHROPLASTY Right   ? UPPER GI ENDOSCOPY    ? ? ?Current Outpatient Medications:  ?  celecoxib (CELEBREX) 200 MG capsule, Take 1 capsule (200 mg total) by mouth 2 (two) times daily., Disp: 60  capsule, Rfl: 1 ?  Semaglutide,0.25 or 0.'5MG'$ /DOS, (OZEMPIC, 0.25 OR 0.5 MG/DOSE,) 2 MG/1.5ML SOPN, Inject into the skin., Disp: , Rfl:  ?  acetaminophen (TYLENOL) 325 MG tablet, Take 650 mg by mouth every 6 (six) hours as needed., Disp: , Rfl:  ?  atenolol (TENORMIN) 25 MG tablet, Take 1 tablet (25 mg total) by mouth daily. (Patient taking differently: Take 50 mg by mouth at bedtime.), Disp: 90 tablet, Rfl: 3 ?  b complex vitamins capsule, Take 1 capsule by mouth daily., Disp: , Rfl:  ?  Biotin 5000 MCG TABS, Take 5,000 mg by mouth daily., Disp: , Rfl:  ?  Calcium-Magnesium-Zinc (CAL-MAG-ZINC PO), Take 1 tablet by mouth daily., Disp: , Rfl:  ?  MOUNJARO 5 MG/0.5ML Pen, SMARTSIG:5 Milligram(s) SUB-Q Once a Week, Disp: , Rfl:  ?  Multiple Vitamin (MULTIVITAMIN WITH MINERALS) TABS tablet, Take 1 tablet by mouth daily., Disp: , Rfl:  ?  oxybutynin (DITROPAN-XL) 10 MG 24 hr tablet, Take 10 mg by mouth at bedtime. , Disp: , Rfl:  ?  triamcinolone ointment (KENALOG) 0.5 %, SMARTSIG:sparingly Topical 2-4 Times Daily, Disp: , Rfl:  ?  UBRELVY 100 MG TABS, Take by mouth., Disp: , Rfl:  ?  Vitamin D3 (VITAMIN D) 25 MCG tablet, Take 1,000 Units by mouth daily., Disp: , Rfl:  ?  zonisamide (ZONEGRAN) 100  MG capsule, Take 100 mg by mouth daily., Disp: , Rfl:  ? ?No Known Allergies ?Review of Systems ?Objective:  ?There were no vitals filed for this visit. ? ?General: Well developed, nourished, in no acute distress, alert and oriented x3  ? ?Dermatological: Skin is warm, dry and supple bilateral. Nails x 10 are well maintained; remaining integument appears unremarkable at this time. There are no open sores, no preulcerative lesions, no rash or signs of infection present. ? ?Vascular: Dorsalis Pedis artery and Posterior Tibial artery pedal pulses are 2/4 bilateral with immedate capillary fill time. Pedal hair growth present. No varicosities and no lower extremity edema present bilateral.  ? ?Neruologic: Grossly intact via light  touch bilateral. Vibratory intact via tuning fork bilateral. Protective threshold with Semmes Wienstein monofilament intact to all pedal sites bilateral. Patellar and Achilles deep tendon reflexes 2+ bilateral. No Babinski or clonus noted bilateral.  ? ?Musculoskeletal: No gross boney pedal deformities bilateral. No pain, crepitus, or limitation noted with foot and ankle range of motion bilateral. Muscular strength 5/5 in all groups tested bilateral.  Foot exam also demonstrates some what appears to be 2 portal scars to the anterior ankle demonstrating a medial scar for up what appears to be possible deltoid repair or tibialis anterior tendon repair also a healing incision to her lateral part of her left ankle such as a peroneal tendon repair or possibly a anterior talofibular fibular ligament or calcaneofibular ligament. ? ?Gait: Unassisted, Nonantalgic.  ? ? ?Radiographs: ? ?Radiographs demonstrate a mild to moderate pes planovalgus of the left foot.  Also demonstrates osteoarthritic changes of the midfoot at the tarsometatarsal joints also demonstrating some changes at the talonavicular joint.  Ankle joint itself appears to be demonstrating considerable subchondral cystic changes.  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   ? ?Assessment & Plan:  ? ?Assessment: Chronic unrelenting 4 out of 10 pain right foot every day status post surgical intervention June 09, 2019 after an injury in May 2020.  Considerable physical therapy cortisone injections nothing alleviates her symptoms. ? ? ? ?Plan: At this point it does demonstrate osteoarthritic changes her tendons and ligaments appear to be intact. ? ?I am requesting operative note from previous surgeon and referring her to Dr. Sherryle Lis who will see her next Thursday.  This may result in further imaging and further surgical intervention. ? ? ? ? ?Vernal Hritz T. Hepler, DPM ?

## 2021-08-07 ENCOUNTER — Ambulatory Visit: Payer: 59 | Admitting: Podiatry

## 2021-08-07 DIAGNOSIS — G5792 Unspecified mononeuropathy of left lower limb: Secondary | ICD-10-CM

## 2021-08-07 DIAGNOSIS — M958 Other specified acquired deformities of musculoskeletal system: Secondary | ICD-10-CM

## 2021-08-07 DIAGNOSIS — M25372 Other instability, left ankle: Secondary | ICD-10-CM | POA: Diagnosis not present

## 2021-08-11 ENCOUNTER — Encounter: Payer: Self-pay | Admitting: Podiatry

## 2021-08-11 NOTE — Progress Notes (Signed)
Subjective:  Patient ID: Carmen Henderson, female    DOB: [birth year],  MRN: [id number]  Chief Complaint  Patient presents with   Foot Pain    Capsulitis of left foot and Capsulitis of left ankle.    51 y.o. female presents with the above complaint. History confirmed with patient.  She was referred to me by Dr. Milinda Pointer for evaluation of ongoing left ankle issues.  She originally had an injury in May 2020 was diagnosed with a bad sprain and no fracture treated physical therapy in a cam boot which did not improve.  In March 2021 she underwent surgical reconstruction, preop MRI had shown an osteochondral defect of the navicular and talus both of these were treated in an open and arthroscopic fashion as well as medial and lateral ankle ligament reconstruction.  She did a PT in CAM boot for 6 months after surgery still did not improve.  A new MRI was completed in December.  Objective:  Physical Exam: warm, good capillary refill, no trophic changes or ulcerative lesions, normal DP and PT pulses, normal sensory exam, and left ankle has edema and pain to palpation, no gross instability or pain over the medial or lateral ankle ligaments, minimal pain with compression and manipulation of the ankle and talonavicular joint she does have significant pain over palpation and manipulation around the incision and portals   Radiographs: I reviewed her radiographs that were taken on 07/31/2021 there is degenerative changes in the midfoot around the [place] joint as well as lucency in the medial talar dome  03/06/2021 3:05 PM EST   HISTORY:  eval [place] joint  TECHNIQUE:  MRI of the left ankle without contrast.  COMPARISON: 03/08/2019 MRI.  FINDINGS: Bones: No acute fracture or osteonecrosis.  Joints: No joint effusions. -A 4 mm osteochondral lesion of the lateral talar dome appears similar to slightly less conspicuous. -Progression of medial talar dome osteochondral lesion with subchondral cysts measuring 12 x 8  mm. -New osteochondral lesion of the central talar dome measuring approximately 12 x 10 mm with subchondral cysts and low-grade marrow edema. -Moderate denudation of the central and medial talar dome cartilage. Degenerative osteochondral changes along the anterior tibial plafond, with anterior tibiotalar osteophytes. Scarring of the anterior-medial tibiotalar joint capsule. -Subtalar, talonavicular, calcaneocuboid cartilage maintained. -Advanced arthrosis at the medial cuneonavicular joint, with subchondral cysts and dorsal osteophytes.  -Advanced arthrosis at the second-fourth tarsometatarsal joints.  Ligaments: Surgical changes of medial and lateral ligament reconstructions. Susceptibility artifact and scarring limits evaluation of the ligaments, with no obvious high-grade re-tear. Tendons: Intact and in anatomic position. Limited evaluation of the peroneal tendons in the retromalleolar groove because of artifact. Achilles tendon: Unremarkable. Plantar fascia: Intact. Tarsal tunnel and sinus tarsi: Unremarkable. Other: Feathery edema in the extensor digitorum brevis musculature with volume loss.. Assessment:   1. Osteochondral defect of talus   2. Neuritis of left foot   3. Instability of left ankle joint      Plan:  Patient was evaluated and treated and all questions answered.  Continues to have significant pain despite surgical intervention last year.  I reviewed her preop and postop MRI reports as well as her operative report.  She has improved in the areas of the ankle instability has no pain on the medial lateral ankle ligaments or over the peroneal tubercle that was excised.  She seems to have fairly good range of motion and manipulation of the ankle joint and talonavicular joint on exam but on weightbearing has significant pain  still.  Most of the pain seems to be centered around the incisions.  Is possible there is a entrapment neuritis.  I would like to see the images from the MRI  as well prior to proceeding with further testing.  Discussed with her a CT scan may help to evaluate the underlying cyst better than her most recent MRI.  Nerve conductions testing could also help [place] decide if there is any entrapment in the nerves although would consider corticosteroid injection diagnostically along the scar to alleviate this first.  Due to her age and weight a total ankle replacement would not be without significant risk for likely need for revision at some point.  She may be a candidate for bulk allograft transfer although this is something that I would need to refer her out for.  A brace such as an [place] brace may give her more support but this is not a long-term solution at her age and activity level.  She will let the imaging center know to send me a CD of the images and I will discuss with her further once I had a chance to review them  No follow-ups on file.

## 2021-08-15 LAB — HM DIABETES EYE EXAM

## 2021-08-19 ENCOUNTER — Telehealth: Payer: Self-pay | Admitting: *Deleted

## 2021-08-19 NOTE — Telephone Encounter (Signed)
Patient is calling for the status of an MRI that was supposed to be emailed  from [facility] actual imaging. Please contact if have not received.

## 2021-08-20 ENCOUNTER — Other Ambulatory Visit (INDEPENDENT_AMBULATORY_CARE_PROVIDER_SITE_OTHER): Payer: 59

## 2021-08-20 DIAGNOSIS — E0500 Thyrotoxicosis with diffuse goiter without thyrotoxic crisis or storm: Secondary | ICD-10-CM | POA: Diagnosis not present

## 2021-08-20 LAB — TSH: TSH: 2.24 u[IU]/mL (ref 0.35–5.50)

## 2021-08-20 LAB — T4, FREE: Free T4: 0.67 ng/dL (ref 0.60–1.60)

## 2021-08-20 LAB — T3, FREE: T3, Free: 3.6 pg/mL (ref 2.3–4.2)

## 2021-09-10 NOTE — Telephone Encounter (Signed)
In epic under Care everywhere, [facility],believe those are results, will print and place in your folder.

## 2021-09-10 NOTE — Telephone Encounter (Signed)
I need the actual images on a disk. She requested they send it to me and they only sent a disc with x-rays not the MRI

## 2021-09-11 ENCOUNTER — Encounter: Payer: Self-pay | Admitting: Podiatry

## 2021-09-17 NOTE — Telephone Encounter (Signed)
Faxed request for the actual images to [facility],called them as well, no answer, left message to send images on disk.

## 2021-10-07 ENCOUNTER — Telehealth: Payer: Self-pay | Admitting: *Deleted

## 2021-10-07 NOTE — Telephone Encounter (Signed)
MRI CD came in for patient, given to physician.10/07/21.

## 2021-10-15 ENCOUNTER — Telehealth: Payer: Self-pay | Admitting: Podiatry

## 2021-10-15 NOTE — Telephone Encounter (Signed)
Did you get the MRI results, [facility] mailed  them on 10/02/21 ?

## 2021-11-15 ENCOUNTER — Encounter (HOSPITAL_BASED_OUTPATIENT_CLINIC_OR_DEPARTMENT_OTHER): Payer: Self-pay | Admitting: Emergency Medicine

## 2021-11-15 ENCOUNTER — Other Ambulatory Visit: Payer: Self-pay

## 2021-11-15 ENCOUNTER — Emergency Department (HOSPITAL_BASED_OUTPATIENT_CLINIC_OR_DEPARTMENT_OTHER): Payer: 59

## 2021-11-15 ENCOUNTER — Emergency Department (HOSPITAL_BASED_OUTPATIENT_CLINIC_OR_DEPARTMENT_OTHER)
Admission: EM | Admit: 2021-11-15 | Discharge: 2021-11-15 | Disposition: A | Discharge status: Home / Self Care | Payer: 59 | Attending: Emergency Medicine | Admitting: Emergency Medicine

## 2021-11-15 DIAGNOSIS — M79672 Pain in left foot: Secondary | ICD-10-CM | POA: Insufficient documentation

## 2021-11-15 DIAGNOSIS — M542 Cervicalgia: Secondary | ICD-10-CM | POA: Insufficient documentation

## 2021-11-15 DIAGNOSIS — M25512 Pain in left shoulder: Secondary | ICD-10-CM | POA: Insufficient documentation

## 2021-11-15 DIAGNOSIS — R519 Headache, unspecified: Secondary | ICD-10-CM | POA: Diagnosis not present

## 2021-11-15 DIAGNOSIS — E119 Type 2 diabetes mellitus without complications: Secondary | ICD-10-CM | POA: Insufficient documentation

## 2021-11-15 DIAGNOSIS — S60212A Contusion of left wrist, initial encounter: Secondary | ICD-10-CM | POA: Insufficient documentation

## 2021-11-15 DIAGNOSIS — Z794 Long term (current) use of insulin: Secondary | ICD-10-CM | POA: Insufficient documentation

## 2021-11-15 DIAGNOSIS — Y92410 Unspecified street and highway as the place of occurrence of the external cause: Secondary | ICD-10-CM | POA: Diagnosis not present

## 2021-11-15 DIAGNOSIS — M79662 Pain in left lower leg: Secondary | ICD-10-CM | POA: Diagnosis not present

## 2021-11-15 DIAGNOSIS — S6992XA Unspecified injury of left wrist, hand and finger(s), initial encounter: Secondary | ICD-10-CM | POA: Diagnosis present

## 2021-11-15 DIAGNOSIS — E039 Hypothyroidism, unspecified: Secondary | ICD-10-CM | POA: Insufficient documentation

## 2021-11-15 MED ORDER — IBUPROFEN 600 MG PO TABS: 600.0000 mg | ORAL_TABLET | Freq: Four times a day (QID) | ORAL | 0 refills | Status: AC | PRN

## 2021-11-15 MED ORDER — CYCLOBENZAPRINE HCL 10 MG PO TABS: 10.0000 mg | ORAL_TABLET | Freq: Two times a day (BID) | ORAL | 0 refills | Status: AC | PRN

## 2021-11-15 MED ORDER — ONDANSETRON 4 MG PO TBDP
8.0000 mg | ORAL_TABLET | Freq: Once | ORAL | Status: AC
Start: 2021-11-15 — End: 2021-11-15
  Administered 2021-11-15: 8 mg via ORAL
  Filled 2021-11-15: qty 2

## 2021-11-15 MED ORDER — IBUPROFEN 400 MG PO TABS
600.0000 mg | ORAL_TABLET | Freq: Once | ORAL | Status: AC
  Administered 2021-11-15: 600 mg via ORAL
  Filled 2021-11-15: qty 1

## 2021-11-15 MED ORDER — FLUORESCEIN SODIUM 1 MG OP STRP
1.0000 | ORAL_STRIP | Freq: Once | OPHTHALMIC | Status: AC
  Administered 2021-11-15: 1 via OPHTHALMIC
  Filled 2021-11-15: qty 1

## 2021-11-15 MED ORDER — TETRACAINE HCL 0.5 % OP SOLN
2.0000 [drp] | Freq: Once | OPHTHALMIC | Status: AC
  Administered 2021-11-15: 2 [drp] via OPHTHALMIC
  Filled 2021-11-15: qty 4

## 2021-11-15 NOTE — ED Notes (Signed)
Tonopen, slit lamp and woods lamp at bedside
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
## 2021-11-15 NOTE — ED Notes (Signed)
Pt discharged to home. Discharge instructions have been discussed with patient and/or family members. Pt verbally acknowledges understanding d/c instructions, and endorses comprehension to checkout at registration before leaving.  °

## 2021-11-15 NOTE — Discharge Instructions (Addendum)
Note the work-up today was overall reassuring.  We saw no signs of brain bleed or acute fracture of body parts imaged.  Your eye was evaluated in detail and showed no signs of abnormality.  Even having so, close follow-up with ophthalmology recommended for reevaluation in 2 to 3 days.  In the meantime, rest, ice affected areas, take ibuprofen and muscle laxer as needed.  Note that the muscle laxer can cause drowsiness so please do not drive or operate machinery until you determine what effects this medication has on you.  I also recommend evaluation by primary care provider in 2 to 3 days for reevaluation of your symptoms.  Please do not hesitate to return to the emergency department if the worrisome signs and symptoms we discussed become apparent.

## 2021-12-01 ENCOUNTER — Other Ambulatory Visit: Payer: Self-pay | Admitting: Obstetrics and Gynecology

## 2021-12-01 DIAGNOSIS — Z803 Family history of malignant neoplasm of breast: Secondary | ICD-10-CM

## 2022-01-19 ENCOUNTER — Other Ambulatory Visit: Payer: 59

## 2022-02-05 ENCOUNTER — Ambulatory Visit
Admission: RE | Admit: 2022-02-05 | Discharge: 2022-02-05 | Disposition: A | Discharge status: Home / Self Care | Payer: 59 | Source: Ambulatory Visit | Attending: Obstetrics and Gynecology | Admitting: Obstetrics and Gynecology

## 2022-02-05 DIAGNOSIS — Z803 Family history of malignant neoplasm of breast: Secondary | ICD-10-CM

## 2022-05-21 ENCOUNTER — Ambulatory Visit: Payer: 59 | Admitting: Internal Medicine

## 2022-05-21 ENCOUNTER — Encounter: Payer: Self-pay | Admitting: Internal Medicine

## 2022-05-21 VITALS — BP 120/76 | HR 90 | Ht 59.0 in | Wt 260.4 lb

## 2022-05-21 DIAGNOSIS — E559 Vitamin D deficiency, unspecified: Secondary | ICD-10-CM | POA: Diagnosis not present

## 2022-05-21 DIAGNOSIS — R7303 Prediabetes: Secondary | ICD-10-CM | POA: Diagnosis not present

## 2022-05-21 DIAGNOSIS — E0500 Thyrotoxicosis with diffuse goiter without thyrotoxic crisis or storm: Secondary | ICD-10-CM

## 2022-05-21 LAB — VITAMIN D 25 HYDROXY (VIT D DEFICIENCY, FRACTURES): VITD: 34.15 ng/mL (ref 30.00–100.00)

## 2022-05-21 LAB — T4, FREE: Free T4: 0.68 ng/dL (ref 0.60–1.60)

## 2022-05-21 LAB — TSH: TSH: 0.68 u[IU]/mL (ref 0.35–5.50)

## 2022-05-21 NOTE — Patient Instructions (Signed)
Please continue to stay off methimazole.  Continue vitamin D 4000 units daily.  Please stop at the lab.  Please come back for a follow-up appointment in 1 year.

## 2022-05-21 NOTE — Progress Notes (Signed)
Patient ID: [name], female   DOB: 03/06/1971, 52 y.o.   MRN: [id number]   HPI  Carmen Henderson is a 52 y.o.-year-old female, initially referred by her PCP, Dr. Charlton Henderson, returning for follow-up for Graves' disease.  Last visit 1 year ago.  Interim history: She has increased urination (was on oxybutynin, now off), nausea, chest pain. Also, no tremors, palpitations, anxiety, insomnia. She now has heat intolerance. She tried Ozempic for weight loss >> but she developed blurry vision and stopped. She reduced starches since 11/2021. Now back on Ozempic 0.5 mg weekly - tolerated well. She saw ophthalmology >> dry eyes, no Graves Ophthalmopathy. On Refresh eyedrops.  Reviewed history: She has a history of increased vaginal bleeding 2/2 fibroids >> hospitalized in 10/2017. She had a Hb 7.9 then and she was in shock. Given 2 units of blood and started on iron and Megace -changed to prn.  She had TAH in 01/2018.   We added methimazole 5 mg twice a day and atenolol 25 mg daily.  We decreased the dose to methimazole 5 mg once a day in 03/2017.  Afterwards, in 11/2017, we could decrease the dose to 2.5 mg daily.    We stopped methimazole completely in 10/2018.    Labs remained normal afterwards.  Reviewed her TFTs: 12/02/2021: TSH 0.62, normal Lab Results  Component Value Date   TSH 2.24 08/20/2021   TSH 3.55 05/20/2021   TSH 2.59 05/15/2020   TSH 1.54 07/14/2019   TSH 1.87 11/11/2018   TSH 1.33 03/08/2018   TSH 1.88 12/20/2017   TSH 4.33 11/11/2017   TSH 1.755 11/05/2017   TSH 1.87 06/14/2017   FREET4 0.67 08/20/2021   FREET4 0.58 (L) 05/20/2021   FREET4 0.69 05/15/2020   FREET4 0.66 07/14/2019   FREET4 0.63 11/11/2018   FREET4 0.65 03/08/2018   FREET4 0.66 12/20/2017   FREET4 0.68 11/11/2017   FREET4 0.65 06/14/2017   FREET4 0.55 (L) 04/01/2017  Labs checked by PCP 05/01/2019: TSH 4.843 (0.45-5.33) 12/25/2016: TSH 0.188 (0.45-5.33), free T4 0.8 (0.6-1.4), free T3  4.51 (2.3-4.2), TPO Abs 834.2, ATA <1.8 12/04/2015: TSH 2.129  Her TSI antibodies normalized in 11/2017 and remain low: Lab Results  Component Value Date   TSI 103 11/11/2018   TSI 119 12/20/2017   TSI 446 (H) 02/17/2017   Pt denies: - feeling nodules in neck - hoarseness - dysphagia - choking  Pt does have a FH of thyroid ds.: daughter and mother. No FH of thyroid cancer. No h/o radiation tx to head or neck. No herbal supplements. + Biotin use - not in 2 weeks. No recent steroids use.   HbA1c levels: 12/02/2021: HbA1c 6.8% Lab Results  Component Value Date   HGBA1C 6.2 (A) 05/20/2021   HGBA1C 6.1 05/15/2020  11-02/2021: HbA1c 6.3%  She has a history of vitamin D deficiency: 12/02/2021: Vitamin D 32 Lab Results  Component Value Date   VD25OH 54.85 05/20/2021   VD25OH 21.3 (L) 05/15/2020  VitD 18 (05/01/2019)  I advised her to increase her vitamin D supplement from 1000 to 2000 units daily in 04/2019.  In 04/2020, we increased the dose from 2000 to 4000 units.    In summer 2021, she started oxybutynin for increased urination. She also has a history of migraines.  On beta-blocker per neurology.  ROS: + See HPI   I reviewed pt's medications, allergies, PMH, social hx, family hx, and changes were documented in the history of present illness. Otherwise, unchanged  from my initial visit note.  Past Medical History:  Diagnosis Date   Anemia    Dysrhythmia    tachycardia   Fibroids    Graves disease    Headache    History of blood transfusion 10/2017   [facility] [place] 2 units   Hypothyroidism    Migraine    Seasonal allergies    Wears glasses    Past Surgical History:  Procedure Laterality Date   CARPAL TUNNEL RELEASE Right    CARPAL TUNNEL RELEASE Left    CESAREAN SECTION     x2   COLONOSCOPY WITH PROPOFOL N/A 03/01/2020   Procedure: COLONOSCOPY WITH PROPOFOL;  Surgeon: Carol Ada, MD;  Location: [facility];  Service: Endoscopy;  Laterality: N/A;   FOOT SURGERY Left     HYSTERECTOMY ABDOMINAL WITH SALPINGECTOMY Bilateral 02/03/2018   Procedure: HYSTERECTOMY ABDOMINAL WITH BILATERAL SALPINGECTOMY, LEFT OVARIAN CYSTECTOMY AND EXTENSIVE LYSIS OF ADHESIONS;  Surgeon: Azucena Fallen, MD;  Location: [facility];  Service: Gynecology;  Laterality: Bilateral;  2 1/2 hrs.   IR GENERIC HISTORICAL  05/29/2014   IR RADIOLOGIST EVAL & MGMT 05/29/2014 Sandi Mariscal, MD [facility] INTERV RAD - uterine embolization    KNEE ARTHROPLASTY Right    UPPER GI ENDOSCOPY     Social History   Social History   Marital status: Single    Spouse name: N/A   Number of children: 2   Occupational History   Physicist, medical   Social History Main Topics   Smoking status: Never Smoker   Smokeless tobacco: Never Used   Alcohol use No   Drug use: No   Current Outpatient Medications on File Prior to Visit  Medication Sig Dispense Refill   acetaminophen (TYLENOL) 325 MG tablet Take 650 mg by mouth every 6 (six) hours as needed.     atenolol (TENORMIN) 25 MG tablet Take 1 tablet (25 mg total) by mouth daily. (Patient taking differently: Take 50 mg by mouth at bedtime.) 90 tablet 3   b complex vitamins capsule Take 1 capsule by mouth daily.     Biotin 5000 MCG TABS Take 5,000 mg by mouth daily.     Calcium-Magnesium-Zinc (CAL-MAG-ZINC PO) Take 1 tablet by mouth daily.     celecoxib (CELEBREX) 200 MG capsule Take 1 capsule (200 mg total) by mouth 2 (two) times daily. 60 capsule 1   cyclobenzaprine (FLEXERIL) 10 MG tablet Take 1 tablet (10 mg total) by mouth 2 (two) times daily as needed for muscle spasms. 20 tablet 0   ibuprofen (ADVIL) 600 MG tablet Take 1 tablet (600 mg total) by mouth every 6 (six) hours as needed. 30 tablet 0   MOUNJARO 5 MG/0.5ML Pen SMARTSIG:5 Milligram(s) SUB-Q Once a Week     Multiple Vitamin (MULTIVITAMIN WITH MINERALS) TABS tablet Take 1 tablet by mouth daily.     oxybutynin (DITROPAN-XL) 10 MG 24 hr tablet Take 10 mg by mouth at bedtime.      Semaglutide,0.25 or  0.'5MG'$ /DOS, (OZEMPIC, 0.25 OR 0.5 MG/DOSE,) 2 MG/1.5ML SOPN Inject into the skin.     triamcinolone ointment (KENALOG) 0.5 % SMARTSIG:sparingly Topical 2-4 Times Daily     UBRELVY 100 MG TABS Take by mouth.     Vitamin D3 (VITAMIN D) 25 MCG tablet Take 1,000 Units by mouth daily.     zonisamide (ZONEGRAN) 100 MG capsule Take 100 mg by mouth daily.     No current facility-administered medications on file prior to visit.   No Known Allergies  FH:  DM in sisters and mother HTN in mother and sister Heart ds in mother Br CA in mother, leukemia in father + see HPI  PE: BP 120/76 (BP Location: Right Arm, Patient Position: Sitting, Cuff Size: Normal)   Pulse 90   Ht '4\' 11"'$  (1.499 m)   Wt 260 lb 6.4 oz (118.1 kg)   LMP 10/30/2017 (Exact Date)   SpO2 95%   BMI 52.59 kg/m  Wt Readings from Last 3 Encounters:  05/21/22 260 lb 6.4 oz (118.1 kg)  11/15/21 261 lb (118.4 kg)  05/20/21 260 lb 9.6 oz (118.2 kg)   Constitutional: overweight, in NAD Eyes:  EOMI, no exophthalmos ENT: no neck masses, no cervical lymphadenopathy Cardiovascular: RRR, No MRG Respiratory: CTA B Musculoskeletal: no deformities Skin:no rashes Neurological: no tremor with outstretched hands  ASSESSMENT: 1.  Graves' disease  2.  Vitamin D deficiency  3. Prediabetes  PLAN:  1.  Patient with history of mildly low TSH and high free T4, without thyrotoxic symptoms.  No weight loss, heat intolerance, tremors, palpitations.  She did have fatigue.  However, she also had a low vitamin D and was also perimenopausal, which could have contributed to fatigue.  She was also having night sweats, most likely perimenopausal. -Her TSI antibodies were elevated in the past, pointing towards a diagnosis of Graves' disease, but they normalized at last check -We initially had her on methimazole 5 mg twice a day and atenolol 25 mg daily, we were able to decrease the dose and then stop methimazole completely in 10/2018.  We were also able  to stop her beta-blocker then, but this was restarted by her neurologist for headaches. -At last visit, TFTs were normal so we continued off methimazole.  She also had normal TSH levels in 07/2021 and 11/2021. -At today's visit, she does not report hyperthyroid symptoms.  She previously had cold intolerance and we discussed that oxybutynin could have contributed.  She is now off oxybutynin and feels she has more heat intolerance but she is also perimenopausal. -We will recheck her TFTs today and may need to restart methimazole if they are abnormal -I advised her to return in 1 year  2.  Vitamin D deficiency -She had a low vitamin D level, at 18, in the past -At last visit, vitamin D was normal, at 64.85, and we continued 4000 units vitamin D daily -Another vitamin D level was normal, but lower, in 11/2021 -We will recheck her vitamin D level today  3. Prediabetes -Reviewed latest HbA1c from a year ago: 6.2%, slightly lower -However, in 11/2021 she had another HbA1c which was higher, at 6.8%,  In the diabetic range. -She tried Ozempic in the past but this caused some blurry vision and she stopped.  However, she was started back on Ozempic 0.5 mg weekly by PCP-now tolerated well.  She also started to change her diet, by cutting down starches since last visit. -At last visit, she gained 11 pounds since her previous visit.  I suggested the weight management clinic but she did not pursue this. -Weight is stable at today's visit   Component     Latest Ref Rng 05/21/2022  TSH     0.35 - 5.50 uIU/mL 0.68   T4,Free(Direct)     0.60 - 1.60 ng/dL 0.68   VITD     30.00 - 100.00 ng/mL 34.15   Thyroid test and vitamin D normal.  Carmen Kingdom, MD PhD [facility] Endocrinology

## 2023-03-01 LAB — HM DIABETES EYE EXAM

## 2023-03-12 ENCOUNTER — Encounter: Payer: Self-pay | Admitting: Internal Medicine

## 2023-05-21 ENCOUNTER — Ambulatory Visit: Payer: 59 | Admitting: Internal Medicine

## 2023-05-25 ENCOUNTER — Ambulatory Visit: Payer: 59 | Admitting: Internal Medicine

## 2023-11-19 ENCOUNTER — Encounter: Payer: Self-pay | Admitting: Internal Medicine

## 2023-11-19 ENCOUNTER — Ambulatory Visit: Admitting: Internal Medicine

## 2023-11-19 VITALS — BP 124/80 | HR 85 | Ht 59.0 in | Wt 271.4 lb

## 2023-11-19 DIAGNOSIS — E559 Vitamin D deficiency, unspecified: Secondary | ICD-10-CM | POA: Diagnosis not present

## 2023-11-19 DIAGNOSIS — E0500 Thyrotoxicosis with diffuse goiter without thyrotoxic crisis or storm: Secondary | ICD-10-CM

## 2023-11-19 LAB — TSH: TSH: 1.85 m[IU]/L

## 2023-11-19 LAB — T3, FREE: T3, Free: 2.8 pg/mL (ref 2.3–4.2)

## 2023-11-19 LAB — T4, FREE: Free T4: 1 ng/dL (ref 0.8–1.8)

## 2023-11-19 NOTE — Progress Notes (Addendum)
 Patient ID: Carmen Henderson, female   DOB: June 04, 1970, 53 y.o.   MRN: [id number]   HPI  Carmen Henderson is a 53 y.o.-year-old female, initially referred by her PCP, Dr. Lauraine Harvest Cools, returning for follow-up for Graves' disease.  Last visit 1.5 years ago.  Interim history: She denies tremors,  anxiety, insomnia. She continue has heat intolerance. She tried Ozempic for weight loss >> but she developed blurry vision and stopped.  She then restarted Ozempic 0.5 mg weekly - tolerated well >> not off. Since last visit, she had left total ankle arthroplasty 02/2023. She had palpitations and increased BP: 180/120 right after the surgery, not since. She is on a higher dose of Atenolol  - 100 mg daily. She has fatigue.  She has not returned to work yet after her surgery.   Reviewed history: She has a history of increased vaginal bleeding 2/2 fibroids >> hospitalized in 10/2017. She had a Hb 7.9 then and she was in shock. Given 2 units of blood and started on iron and Megace  -changed to prn.  She had TAH in 01/2018.   We added methimazole  5 mg twice a day and atenolol  25 mg daily.  We decreased the dose to methimazole  5 mg once a day in 03/2017.  Afterwards, in 11/2017, we could decrease the dose to 2.5 mg daily.    We stopped methimazole  completely in 10/2018.    Labs remained normal afterwards.  Reviewed her TFTs: 09/20/2023: TSH 3.738, normal Lab Results  Component Value Date   TSH 0.68 05/21/2022   TSH 2.24 08/20/2021   TSH 3.55 05/20/2021   TSH 2.59 05/15/2020   TSH 1.54 07/14/2019   TSH 1.87 11/11/2018   TSH 1.33 03/08/2018   TSH 1.88 12/20/2017   TSH 4.33 11/11/2017   TSH 1.755 11/05/2017   FREET4 0.68 05/21/2022   FREET4 0.67 08/20/2021   FREET4 0.58 (L) 05/20/2021   FREET4 0.69 05/15/2020   FREET4 0.66 07/14/2019   FREET4 0.63 11/11/2018   FREET4 0.65 03/08/2018   FREET4 0.66 12/20/2017   FREET4 0.68 11/11/2017   FREET4 0.65 06/14/2017  12/02/2021: TSH 0.62,  normalL 05/01/2019: TSH 4.843 (0.45-5.33) 12/25/2016: TSH 0.188 (0.45-5.33), free T4 0.8 (0.6-1.4), free T3 4.51 (2.3-4.2), TPO Abs 834.2, ATA <1.8 12/04/2015: TSH 2.129  Her TSI antibodies normalized in 11/2017 and remain low: Lab Results  Component Value Date   TSI 103 11/11/2018   TSI 119 12/20/2017   TSI 446 (H) 02/17/2017   Pt denies: - feeling nodules in neck - hoarseness - dysphagia - choking  Pt does have a FH of thyroid  ds.: daughter and mother. No FH of thyroid  cancer. No h/o radiation tx to head or neck. No herbal supplements. + Biotin use - held for 3 weeks (1000 mcg). No recent steroids use.   HbA1c levels: 09/20/2023: HbA1c 6.3% 12/02/2021: HbA1c 6.8% Lab Results  Component Value Date   HGBA1C 6.2 (A) 05/20/2021   HGBA1C 6.1 05/15/2020  11-02/2021: HbA1c 6.3%  She has a history of vitamin D  deficiency: 09/20/2023: Vitamin D  37.8 Lab Results  Component Value Date   VD25OH 34.15 05/21/2022   VD25OH 54.85 05/20/2021   VD25OH 21.3 (L) 05/15/2020  12/02/2021: Vitamin D  32 05/01/2019: Vitamin D  18   I advised her to increase her vitamin D  supplement from 1000 to 2000 units daily in 04/2019.  In 04/2020, we increased the dose from 2000 to 4000 units.  She continues on this dose.  In summer 2021, she started oxybutynin  for increased urination. She also has a history of migraines.  On beta-blocker per neurology and more recently per PCP.  ROS: + See HPI   I reviewed pt's medications, allergies, PMH, social hx, family hx, and changes were documented in the history of present illness. Otherwise, unchanged from my initial visit note.  Past Medical History:  Diagnosis Date   Anemia    Dysrhythmia    tachycardia   Fibroids    Graves disease    Headache    History of blood transfusion 10/2017   [facility] 2 units   Hypothyroidism    Migraine    Seasonal allergies    Wears glasses    Past Surgical History:  Procedure Laterality Date   CARPAL TUNNEL RELEASE Right     CARPAL TUNNEL RELEASE Left    CESAREAN SECTION     x2   COLONOSCOPY WITH PROPOFOL  N/A 03/01/2020   Procedure: COLONOSCOPY WITH PROPOFOL ;  Surgeon: Rollin Dover, MD;  Location: [facility];  Service: Endoscopy;  Laterality: N/A;   FOOT SURGERY Left    HYSTERECTOMY ABDOMINAL WITH SALPINGECTOMY Bilateral 02/03/2018   Procedure: HYSTERECTOMY ABDOMINAL WITH BILATERAL SALPINGECTOMY, LEFT OVARIAN CYSTECTOMY AND EXTENSIVE LYSIS OF ADHESIONS;  Surgeon: Barbette Knock, MD;  Location: [facility];  Service: Gynecology;  Laterality: Bilateral;  2 1/2 hrs.   IR GENERIC HISTORICAL  05/29/2014   IR RADIOLOGIST EVAL & MGMT 05/29/2014 Norleen Roulette, MD [facility] INTERV RAD - uterine embolization    KNEE ARTHROPLASTY Right    UPPER GI ENDOSCOPY     Social History   Social History   Marital status: Single    Spouse name: N/A   Number of children: 2   Occupational History   Runner, broadcasting/film/video   Social History Main Topics   Smoking status: Never Smoker   Smokeless tobacco: Never Used   Alcohol use No   Drug use: No   Current Outpatient Medications on File Prior to Visit  Medication Sig Dispense Refill   acetaminophen  (TYLENOL ) 325 MG tablet Take 650 mg by mouth every 6 (six) hours as needed.     atenolol  (TENORMIN ) 25 MG tablet Take 1 tablet (25 mg total) by mouth daily. (Patient taking differently: Take 50 mg by mouth at bedtime.) 90 tablet 3   b complex vitamins capsule Take 1 capsule by mouth daily.     Biotin 5000 MCG TABS Take 5,000 mg by mouth daily.     Calcium-Magnesium-Zinc (CAL-MAG-ZINC PO) Take 1 tablet by mouth daily.     celecoxib  (CELEBREX ) 200 MG capsule Take 1 capsule (200 mg total) by mouth 2 (two) times daily. 60 capsule 1   cyclobenzaprine  (FLEXERIL ) 10 MG tablet Take 1 tablet (10 mg total) by mouth 2 (two) times daily as needed for muscle spasms. 20 tablet 0   ibuprofen  (ADVIL ) 600 MG tablet Take 1 tablet (600 mg total) by mouth every 6 (six) hours as needed. 30 tablet 0   Multiple  Vitamin (MULTIVITAMIN WITH MINERALS) TABS tablet Take 1 tablet by mouth daily.     pregabalin (LYRICA) 75 MG capsule Take by mouth.     Semaglutide,0.25 or 0.5MG /DOS, (OZEMPIC, 0.25 OR 0.5 MG/DOSE,) 2 MG/1.5ML SOPN Inject into the skin.     triamcinolone ointment (KENALOG) 0.5 % SMARTSIG:sparingly Topical 2-4 Times Daily     UBRELVY 100 MG TABS Take by mouth.     Vitamin D3 (VITAMIN D ) 25 MCG tablet Take 1,000 Units by mouth daily.     No current facility-administered  medications on file prior to visit.   No Known Allergies  FH: DM in sisters and mother HTN in mother and sister Heart ds in mother Br CA in mother, leukemia in father + see HPI  PE: BP 124/80   Pulse 85   Ht 4' 11 (1.499 m)   Wt 271 lb 6.4 oz (123.1 kg)   LMP 10/30/2017 (Exact Date)   SpO2 96%   BMI 54.82 kg/m  Wt Readings from Last 3 Encounters:  11/19/23 271 lb 6.4 oz (123.1 kg)  05/21/22 260 lb 6.4 oz (118.1 kg)  11/15/21 261 lb (118.4 kg)   Constitutional: overweight, in NAD Eyes:  EOMI, no exophthalmos ENT: no neck masses, no cervical lymphadenopathy Cardiovascular: RRR, No MRG Respiratory: CTA B Musculoskeletal: no deformities Skin:no rashes Neurological: no tremor with outstretched hands  ASSESSMENT: 1.  Graves' disease  2.  Vitamin D  deficiency  3. DM2 - Previously prediabetes - Managed by PCP now - She tried Ozempic in the past but this caused some blurry vision and she stopped.  However, she was started back on Ozempic 0.5 mg weekly by PCP and she started to tolerated well. - I suggested the weight management clinic but she did not pursue this.  PLAN:  1.  Patient with history of mildly low TSH and high free T4, without thyrotoxic symptoms: No weight loss, heat intolerance, tremors, palpitations, but did have some fatigue.  However, she was having a low vitamin D  level and was perimenopausal, which could be due to fatigue.  She also had night sweats, likely perimenopausal. - Her TSI  antibodies were elevated, pointing towards a diagnosis of Graves' disease, but they normalized at last check - We initially had her on methimazole  5 mg twice a day and atenolol  25 mg daily but we were able to gradually decrease the dose of methimazole  and to stop completely in 10/2018.  We were also able to stop her beta-blocker but this was subsequently restarted by her neurologist for headaches.  She mentions that her PCP increased the dose to 100 mg daily mostly to help with blood pressure. - after stopping the methimazole , her TFTs remained consistently normal including at last check on 09/20/2023.  At today's visit, she mentions that she forgot to stop the biotin (1000 mcg daily) at the time of the last set of labs with PCP but she did stop it 3 weeks ago.  We can recheck her tests now. - She does not report hyperthyroid symptoms today. She previously had cold intolerance and we discussed that oxybutynin could have contributed.  At last visit she was off oxybutynin and felt she has more heat intolerance but she is also perimenopausal.  At today's visit, she again describes hot flashes. - We discussed about continuing to have thyroid  tests (at least a TSH) at her annual physical exam by PCP, and she can return to see me if they become ab normal in the future.  2.  Vitamin D  deficiency - She had a low vitamin D  level, over 18, in the last - We continued 4000 units vitamin D  daily - At last visit, her vitamin D  level was normal, and she had another vitamin D  level checked by PCP on 09/20/2023, at 37.8, normal - Further management per PCP  Component     Latest Ref Rng 11/19/2023  Triiodothyronine,Free,Serum     2.3 - 4.2 pg/mL 2.8   T4,Free(Direct)     0.8 - 1.8 ng/dL 1.0   TSH  mIU/L 1.85   Normal TFTs.  We can continue with the plan for the patient to follow with PCP with an annual TSH.  Lela Fendt, MD PhD [facility] Endocrinology

## 2023-11-19 NOTE — Patient Instructions (Addendum)
 Please continue to stay off methimazole .  Continue vitamin D  4000 units daily.  Please stop at the lab.  Please return to see me as needed.

## 2023-11-23 ENCOUNTER — Ambulatory Visit: Payer: Self-pay | Admitting: Internal Medicine
